# Patient Record
Sex: Female | Born: 1960 | Race: White | Hispanic: No | Marital: Married
Health system: Midwestern US, Academic
[De-identification: ages and names within clinical notes are randomized; demographics above are authoritative.]

---

## 2017-04-14 ENCOUNTER — Encounter: Admit: 2017-04-14 | Discharge: 2017-04-14 | Payer: 59

## 2017-04-14 ENCOUNTER — Ambulatory Visit: Admit: 2017-04-14 | Discharge: 2017-04-14 | Payer: 59

## 2017-04-14 ENCOUNTER — Ambulatory Visit: Admit: 2017-04-14 | Discharge: 2017-04-14 | Payer: BC Managed Care – PPO

## 2017-04-14 DIAGNOSIS — Z720 Tobacco use: ICD-10-CM

## 2017-04-14 DIAGNOSIS — R918 Other nonspecific abnormal finding of lung field: Principal | ICD-10-CM

## 2017-04-14 DIAGNOSIS — C3490 Malignant neoplasm of unspecified part of unspecified bronchus or lung: ICD-10-CM

## 2017-04-14 NOTE — Progress Notes
Date of Service: 04/14/2017       Subjective:             Journe Hallmark Laske is a 56 y.o. female.      History of Present Illness  Ms. Argie Lober presents to thoracic surgery clinic for her 6 mo follow up for lung cancer surveillance with CT imaging. Ms. Boldon is a 56 year old who presented with an incidentally found right lower lobe lung mass.??? She then underwent a right thoracoscopic right lower lobectomy and mediastinal lymphadenectomy under the direction of Dr. Bryson Dames on 04/03/16. Pathology confirmed Mucinous adenocarcinoma; Tumor Size: ???3.5 x 2.9 x 2.3 cm Visceral Pleura Invasion Present (supported by the elastic VVG stain); Pathologic Staging (pTNM) pT2aN0Mn/a.  She did seek medical oncology expert opinion.  Chemotherpay was offered with the explaination of risks and benefits.  The patient did not elect to pursue chemotherapy.  Prior CT imaging performed on 10/14/16 demonstrates previous right lower lobectomy with mild soft tissue thickening along the suture line, likely scarring. Follow-up CT chest is recommended in 6 months to assess for stability.  Development of focal groundglass opacity in the left upper, likely focal bronchiolitis. This area can also be reassessed on follow-up imaging. No thoracic lymphadenopathy. Small amount of loculated right pleural fluid. Mild emphysema. As well as a nodular thyroid.  She did establish care with endocrine for evaluation.  Elected surveillance.   ???  Pearson Forster has not had any hospitalizations or procedures since our last visit with her.  She has been busy with travel.  She also exuberently describes that her step son has recently won the lottery.  She states that the cough she was experiencing has resolved and believes this may have been more allergy induced.  She denies symptoms of hemoptysis, chest discomfort or unintentional weight loss.  ???  CT performed today 04/14/17 demonstrates Prior right lower lobectomy. ???Unchanged mild soft tissue thickening along the suture line is most consistent with scarring.  Resolution of the focal groundglass opacity in the left upper lobe that had developed on the prior exam. Slight decrease in the minimal amount of loculated right pleural   fluid. ???Mild emphysema.       Review of Systems   Constitution: Negative.   HENT: Negative.    Eyes: Negative.    Cardiovascular: Negative.    Respiratory: Negative.    Endocrine: Negative.    Hematologic/Lymphatic: Negative.    Skin: Negative.    Musculoskeletal: Positive for joint pain.   Gastrointestinal: Negative.    Genitourinary: Negative.    Neurological: Negative.    Psychiatric/Behavioral: Negative.    Allergic/Immunologic: Negative.      Past Medical History:   Diagnosis Date   ??? Fibroids     uterine   ??? Pulmonary lesion, right      Past Surgical History:   Procedure Laterality Date   ??? THORACOSCOPY Right 04/03/2016    Right video assisted THORACOSCOPY, right lower lobectomy performed by Bryson Dames, MD at [facility]   ??? PARTIAL HYSTERECTOMY  I don't know    fibroids     No Known Allergies  Social History     Social History   ??? Marital status: Married     Spouse name: N/A   ??? Number of children: N/A   ??? Years of education: N/A     Social History Main Topics   ??? Smoking status: Former Smoker     Packs/day: 0.50     Quit date: 10/09/2015   ???  Smokeless tobacco: Never Used      Comment: I don't know   ??? Alcohol use 0.0 oz/week      Comment: occasionally.   ??? Drug use: No   ??? Sexual activity: Not on file     Other Topics Concern   ??? Not on file     Social History Narrative   ??? No narrative on file     Family History   Problem Relation Age of Onset   ??? Cancer-Breast Mother    ??? Parkinson's  Father    ??? Heart Failure Father          Objective:         ??? MULTIVITAMIN (MULTIPLE VITAMINS PO) Take  by mouth.     Vitals:    04/14/17 0935   BP: 122/70   Pulse: 60   Temp: 36.6 ???C (97.9 ???F)   SpO2: 98%   Weight: 66.2 kg (146 lb)   Height: 1.702 m (5' 7) Body mass index is 22.87 kg/m???.     Physical Exam   Constitutional: She is oriented to person, place, and time. She appears well-developed and well-nourished.   HENT:   Head: Normocephalic.   Cardiovascular: Normal rate and regular rhythm.    No murmur heard.  Pulmonary/Chest: Effort normal. No respiratory distress. She has wheezes in the right lower field. She has no rales. She exhibits no tenderness.   Abdominal: Soft.   Lymphadenopathy:     She has no cervical adenopathy.   Neurological: She is alert and oriented to person, place, and time.   Skin: Skin is warm and dry.   Psychiatric: She has a normal mood and affect. Her behavior is normal. Judgment and thought content normal.            Assessment and Plan:  1. Primary mucinous adenocarcinoma of lung (HCC)           Ms. Keliyah Pavlovsky presents to thoracic surgery clinic for lung cancer surveillance.  [name] underwent a right thoracoscopic right lower lobectomy and mediastinal lymphadenectomy under the direction of Dr. Bryson Dames on 04/03/16. Pathology confirmed Mucinous adenocarcinoma; Tumor Size: ???3.5 x 2.9 x 2.3 cm Visceral Pleura Invasion Present (supported by the elastic VVG stain); Pathologic Staging (pTNM) pT2aN0Mn/a.  CT imaging today demonstrates stability of suture line thickening; resolution of left upper lobe ggo.  We are pleased with her progress and will plan to follow up in 6 months time with a CT chest with contrast for continued lung cancer surveillance.  Ms. Columbia is in agreement with plan.  I have also assisted her with establishment of PCP as her PCP has switched to an urgent care setting.  She will call with any questions or concerns.

## 2017-04-23 ENCOUNTER — Encounter: Admit: 2017-04-23 | Discharge: 2017-04-23 | Payer: 59

## 2017-05-28 ENCOUNTER — Encounter: Admit: 2017-05-28 | Discharge: 2017-05-28 | Payer: 59

## 2017-06-02 ENCOUNTER — Ambulatory Visit: Admit: 2017-06-02 | Discharge: 2017-06-02 | Payer: BC Managed Care – PPO

## 2017-06-02 ENCOUNTER — Encounter: Admit: 2017-06-02 | Discharge: 2017-06-02 | Payer: 59

## 2017-06-02 DIAGNOSIS — E041 Nontoxic single thyroid nodule: ICD-10-CM

## 2017-06-02 DIAGNOSIS — M654 Radial styloid tenosynovitis [de Quervain]: ICD-10-CM

## 2017-06-02 DIAGNOSIS — M545 Low back pain: ICD-10-CM

## 2017-06-02 DIAGNOSIS — T7840XA Allergy, unspecified, initial encounter: ICD-10-CM

## 2017-06-02 DIAGNOSIS — M79672 Pain in left foot: ICD-10-CM

## 2017-06-02 DIAGNOSIS — D219 Benign neoplasm of connective and other soft tissue, unspecified: Principal | ICD-10-CM

## 2017-06-02 DIAGNOSIS — E042 Nontoxic multinodular goiter: ICD-10-CM

## 2017-06-02 DIAGNOSIS — M25531 Pain in right wrist: Principal | ICD-10-CM

## 2017-06-02 DIAGNOSIS — J984 Other disorders of lung: ICD-10-CM

## 2017-06-02 DIAGNOSIS — Z0000 Encounter for general adult medical examination without abnormal findings: ICD-10-CM

## 2017-06-02 DIAGNOSIS — C3490 Malignant neoplasm of unspecified part of unspecified bronchus or lung: ICD-10-CM

## 2017-06-02 DIAGNOSIS — B001 Herpesviral vesicular dermatitis: ICD-10-CM

## 2017-06-02 MED ORDER — CARISOPRODOL 350 MG PO TAB
350 mg | ORAL_TABLET | ORAL | 1 refills | Status: AC | PRN
Start: 2017-06-02 — End: ?

## 2017-06-02 MED ORDER — MELOXICAM 15 MG PO TAB
15 mg | ORAL_TABLET | Freq: Every day | ORAL | 1 refills | 30.00000 days | Status: AC
Start: 2017-06-02 — End: 2017-08-17

## 2017-06-02 MED ORDER — VALACYCLOVIR 500 MG PO TAB
ORAL_TABLET | Freq: Two times a day (BID) | 3 refills | Status: AC | PRN
Start: 2017-06-02 — End: 2019-02-24

## 2017-06-02 NOTE — Progress Notes
Subjective:       History of Present Illness  Christina Eaton is a 56 y.o. female with history of mucinous adenocarcinoma of the lung status post lobectomy and mediastinal lymphadenectomy who presents today to establish care.  She was previously seeing Dr. Daisey Eaton who has changed practices.  She was referred by multiple family members who are also my patient.    Overall she reports doing well.  The lung cancer was incidentally diagnosed last July after she did a CT coronary calcium score.  This was very shocking to her.  It is been quite emotional but she is doing well.  She has only told her husband about the diagnosis.  She is trying to keep very quiet.  Her PET scan was negative for residual disease.  She has follow-up scans in February.    She quit smoking over a year ago.  She smoked off and on anywhere from 1-10 cigarettes since she was a teenager.  She was not a heavy smoker.     She exercises on a regular basis mainly walking.  Sometimes she will walk an hour and 1/2-2 hours daily.  Today for instance she has already walked 2-1/2 hours.  [place].  She was doing some weight lifting but is no longer doing that.  She hopes to get back to it.  She also tries yoga.  She has had some pain on her left foot in the ball region.  She thinks it might just be from overdoing it.  She just received some new shoes but does not use them yet.  She has been using a special pad on the balls of her feet for the past couple of weeks as well.  She thinks that will help.  She can Artie see an improvement.  Next    She also complains of right wrist pain.  Her right wrist really bothers her and it has for the past 5-6 months.  She recalls mowing the lawn back in July and having pain afterwards.  She has a couple of different braces that she uses intermittently but she has not used anything consistently.  She uses Aleve regularly with good relief.  It is starting to affect the things that she does because it aches on a regular basis.    She mentions several different supplements that she takes in order to maintain good health.  She is interested in speaking to somebody such as an integrative medicine dietitian to get more details on what she should be taking and what might actually help her.  She is currently taking something called Camu Camu, Acerola, Spirulina, MSM, multivitamin, l-lysine, vitamin D, and retinal a for her face.    She follows with Dr. Nita Eaton in dermatology and has had one lesion from her left face removed which was benign.  She goes back to see him soon.    She is interested in possible knee injections (Colief) to maintain her knee strength.  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  Sometimes her left one bothers her.  She thinks it swells at times.  It does not lock up or give out.    Her mammogram was normal September 17.  She has never had a colonoscopy but she did cologuard last year that was negative.  She is not interested in vaccines but would like to research the shingles vaccine.    Ms. Christina Eaton presents to thoracic  surgery clinic for lung cancer surveillance.  Ms. Christina Eaton underwent a right thoracoscopic right lower lobectomy and mediastinal lymphadenectomy under the direction of Dr. Bryson Eaton on 04/03/16. Pathology confirmed Mucinous adenocarcinoma; Tumor Size: ???3.5 x 2.9 x 2.3 cm Visceral Pleura Invasion Present (supported by the elastic VVG stain); Pathologic Staging (pTNM) pT2aN0Mn/a.  CT imaging today demonstrates stability of suture line thickening; resolution of left upper lobe ggo.     Past Medical History:   Diagnosis Date   ??? Allergy    ??? Fibroids     uterine   ??? Pulmonary lesion, right        Past Surgical History:   Procedure Laterality Date   ??? THORACOSCOPY Right 04/03/2016    Right video assisted THORACOSCOPY, right lower lobectomy performed by Christina Dames, MD at [facility]   ??? PARTIAL HYSTERECTOMY  I don't know fibroids       Family History   Problem Relation Age of Onset   ??? Cancer-Breast Mother    ??? Parkinson's  Father    ??? Heart Failure Father    ??? Cancer Maternal Aunt        Social History     Social History   ??? Marital status: Married     Spouse name: N/A   ??? Number of children: N/A   ??? Years of education: N/A     Occupational History   ??? Not on file.     Social History Main Topics   ??? Smoking status: Former Smoker     Packs/day: 0.50     Quit date: 10/09/2015   ??? Smokeless tobacco: Never Used      Comment: I don't know   ??? Alcohol use 0.0 oz/week      Comment: occasionally -variable amount depending on the occasion   ??? Drug use: No   ??? Sexual activity: Not on file     Other Topics Concern   ??? Not on file     Social History Narrative    She has been married for 20 years.  She has no children but her husband has some.  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  She loves being active outdoors.  She hikes and walks often.  [place] dogs.  [place]       No Known Allergies              Review of Systems   Constitutional: Negative for appetite change, chills, diaphoresis, fatigue, fever and unexpected weight change.   HENT: Negative for congestion, hearing loss, mouth sores, postnasal drip, rhinorrhea, sinus pressure, sneezing, sore throat and voice change.    Eyes: Negative for visual disturbance.   Respiratory: Negative for cough, shortness of breath and wheezing.    Cardiovascular: Negative for chest pain, palpitations and leg swelling.   Gastrointestinal: Negative for abdominal distention, abdominal pain, blood in stool, constipation, diarrhea, nausea and vomiting.   Genitourinary: Negative for dysuria, enuresis, frequency, hematuria, menstrual problem and urgency.   Musculoskeletal: Positive for arthralgias (wrist, foot) and joint swelling. Negative for back pain (occasionally low back), gait problem and myalgias.   Skin: Negative for rash and wound.   Allergic/Immunologic: Positive for environmental allergies. Negative for immunocompromised state.   Neurological: Negative for dizziness, seizures, syncope, weakness, light-headedness, numbness and headaches.   Hematological: Negative for adenopathy. Does not bruise/bleed  easily.   Psychiatric/Behavioral: Negative for dysphoric mood and sleep disturbance. The patient is not nervous/anxious.          Objective:         ??? carisoprodol(+) (SOMA) 350 mg tablet Take one tablet by mouth every 8 hours as needed for Muscle Cramps.   ??? meloxicam (MOBIC) 15 mg tablet Take one tablet by mouth daily. With food   ??? MULTIVITAMIN (MULTIPLE VITAMINS PO) Take  by mouth.   ??? valACYclovir (VALTREX) 500 mg tablet 500mg  PO BID x 1 day prn cold sore flare     Vitals:    06/02/17 1419   BP: 104/67   Pulse: 71   Resp: 16   Temp: 36.4 ???C (97.6 ???F)   TempSrc: Oral   Weight: 64.4 kg (142 lb)   Height: 171.5 cm (67.5)     Body mass index is 21.91 kg/m???.     Physical Exam   Constitutional: She is oriented to person, place, and time. She appears well-developed and well-nourished.   HENT:   Head: Normocephalic and atraumatic.   Right Ear: External ear normal.   Left Ear: External ear normal.   Nose: Nose normal.   Mouth/Throat: Oropharynx is clear and moist. No oropharyngeal exudate.   Eyes: Pupils are equal, round, and reactive to light. Conjunctivae and EOM are normal. No scleral icterus.   Neck: Normal range of motion. Neck supple. No thyromegaly present.   Cardiovascular: Normal rate, regular rhythm, normal heart sounds and intact distal pulses.    No murmur heard.  Pulmonary/Chest: Effort normal and breath sounds normal. She has no wheezes. She has no rales.   Abdominal: Soft. Bowel sounds are normal. She exhibits no mass. There is no tenderness.   Musculoskeletal: She exhibits no edema. Right wrist: She exhibits tenderness. She exhibits normal range of motion, no bony tenderness and no swelling.        Left foot: There is tenderness (ball of foot 2/3rd toe). There is normal range of motion and no swelling.   finklestein + right   Lymphadenopathy:     She has no cervical adenopathy.   Neurological: She is alert and oriented to person, place, and time.   Skin: Skin is warm and dry. No rash noted.   Psychiatric: She has a normal mood and affect. Her behavior is normal. Judgment and thought content normal.   Nursing note and vitals reviewed.           Assessment and Plan:     I saw Gerriann Ea today to establish care.     1. Preventative health care    2. Right wrist pain    3. Tenosynovitis, de Quervain    4. Primary mucinous adenocarcinoma of lung (HCC)    5. Thyroid nodule    6. Recurrent cold sores    7. Chronic bilateral low back pain without sciatica    8. Left foot pain    9. Non-toxic multinodular goiter      Problem   Preventative Health Care    Health Maintenance / Prevention  Health Maintenance   Topic Date Due   ??? HIV SCREENING  04/12/1976   ??? SHINGLES RECOMBINANT VACCINE (1 of 2) 04/13/2011   ??? INFLUENZA VACCINE  06/07/2017   ??? BREAST CANCER SCREENING  05/24/2018   ??? PHYSICAL (COMPREHENSIVE) EXAM  06/02/2018   ??? COLORECTAL CANCER SCREENING  01/01/2019   ??? TETANUS VACCINE  12/04/2024   ??? PERTUSSIS VACCINE  Completed   ???  HEPATITIS C SCREENING  Completed     Immunization History   Administered Date(s) Administered   ??? Tdap Vaccine 12/05/2014     Vaccines -    PNA/PPSV23  -declines  Prevnar/PCV13    Zoster (>60y) -Shingrix discussed, she will consider  Influenza (> 50y or RF annually) -  Td/Tdap (every 10 years)  -  DEXA hip/spine - n/a  Low Dose Chest CT- -already getting with [facility]  Colonoscopy - discussed, cologuard neg 6/17, she will consider by next due - 2020   Pap Smear  -s/p hyster benign reasons  Mammogram - normal 05/24/17   Lipids -ordered  ASA -n/a Counseled on age/gender/risk appropriate safety/preventative care items  Depression Screening:  Patient Scores:  PHQ-2: PHQ-2 Score: 0 (06/02/2017  2:20 PM)  PHQ-9: No Data Recorded  Interventions:  PHQ-2: PHQ-2 Score less than 3: No follow-up or recommendations are necessary at this time (06/02/2017  2:20 PM)  Depression Interventions PHQ-2/9: No Data Recorded       Non-Toxic Multinodular Goiter    Saw endo, thought benign  Check TSH yearly     Primary Mucinous Adenocarcinoma of Lung (Hcc)    Mrs. Mezo is a 56 yo female with pmh of tobacco abuse with recently diagnosed mucinous adenocarcinoma of the lung. She was having a calcium score screening done for cardiovascular risk calculation and the imaging showed a mass in her right lower lobe. She was referred to Dr. Ranae Plumber with pulmonary who obtained a PET scan which showed Mild FDG uptake within the right lower lobe pulmonary mass demonstrating maximum SUV of 2.06. Next a CT guided biopsy was done that was not diagnostic of malignancy. On 04/03/16 she was taken for wedge resection with pathologic evaluation concerning for malignancy, so a right lower lobectomy was completed. Pathology showed a 3.5x2.5x2.9cm primary tumor with visceral pleural invasion, 4 lymph nodes sampled were negative for malignancy. PD-L1 expression was 10%. PT2aN0M0, stage IB.    We discussed the role of adjuvant chemotherapy in reducing her risk of recurrence. Given that her tumor was 3.5cm and had a poor prognostic factor of visceral pleural invasion we are recommending adjuvant cisplatin 75mg /m2 and pemetrexed 500mg /m2 Q 21 days x 4 cycles.     She would like some time to think about her options and discuss with family. She plans to call us with her decision. I have already entered the treatment plan should she decide to go forward with chemotherapy we should start within the next 2 weeks. We did explore the adjuvant clinical trial available, but she does not qualify for them due to tumor being <4cm. If she is not planning on adjuvant chemotherapy we would be happy to see her in 6 months with CT chest for surveillance. Per NCCN guidelines surveillance would consist of H&P, labs and CT w/ contrast every 6 months for 2-3 yrs then annually for 5 yrs.     Has follow up with CT in Feb 2019       Right wrist pain - de'quervain's tenosynovitis  - spica thumb brace consistently 4 weeks + mobic  - PT referral  - can send to ortho in future if needed    Left foot pain   - likely bone bruise  - new shoes/support, nsaids  - will get xray if no better    Intermittent low back pain   - refill soma    Recurrent cold sores  - refill valtrex    I will see her back yearly and  sooner as needed.       Orders Placed This Encounter   ??? CBC AND DIFF   ??? COMPREHENSIVE METABOLIC PANEL   ??? LIPID PROFILE   ??? TSH WITH FREE T4 REFLEX today   ??? PHYSICAL THERAPY   ??? carisoprodol(+) (SOMA) 350 mg tablet   ??? valACYclovir (VALTREX) 500 mg tablet   ??? meloxicam (MOBIC) 15 mg tablet     Patient Instructions   It was nice to meet you today. Thank you for coming into clinic.  Try to wear the thumb spica brace as much as possible - definitely all night. Try mobic daily with food for at least 4 weeks then reassess.  We can ask PT if there are other modalities that may help you.  If you don't get called by PT please let me know.   Do fasting labs anytime - no food 8 hrs.   Consider colonoscopy for your next screening in 2020 or sooner if you have issues with your bowels.     Consider Shingrix vaccine.    Ask ortho about knee injections.     The following referral is in place for you to schedule an appoinment at your convenience: PT  Look into [facility] for their dietician/supplement advice services.  (Brad Dyer,DO)    Let me know after 1-2 months if you don't have improvement in your foot with new shoes and the cushion. Follow up with me yearly and sooner as needed.     Routine Clinic Information:  Please don't hesitate to call if you have any problems or questions. My nurse is Carly and she can be reached at [phone].  If she does not answer, please leave a voicemail as she is probably rooming other patients.   You may also message [place] in MyChart.    For refills on medications, please have your pharmacy fax a refill authorization request form to our office at Fax) [phone]. Please allow at least 3 business days for refill requests.     For urgent issues after business hours/weekends/holidays call [phone] and request for the outpatient internal medicine physician to be paged    We offer same day appointments for your acute health concerns. These appointments are on a first come, first serve basis. Please call [phone] if you would like to make an appointment. If I am not available, you can see any of my partners or try to see me the next day (call at 8am).      Take care,     Dr. Ramonita Lab and Wenda Low

## 2017-06-03 ENCOUNTER — Encounter: Admit: 2017-06-03 | Discharge: 2017-06-03 | Payer: 59

## 2017-06-03 DIAGNOSIS — D219 Benign neoplasm of connective and other soft tissue, unspecified: Principal | ICD-10-CM

## 2017-06-03 DIAGNOSIS — J984 Other disorders of lung: ICD-10-CM

## 2017-06-03 DIAGNOSIS — T7840XA Allergy, unspecified, initial encounter: ICD-10-CM

## 2017-06-07 ENCOUNTER — Encounter: Admit: 2017-06-07 | Discharge: 2017-06-07 | Payer: 59

## 2017-06-08 LAB — CBC AND DIFF
Lab: 12 g/dL (ref >=60)
Lab: 13 % (ref 11.0–15.0)
Lab: 32 pg (ref 27.0–33.0)
Lab: 33 g/dL (ref 32.0–36.0)
Lab: 37 % (ref >=60)
Lab: 380 10*3/uL (ref 140–400)
Lab: 6.1 10*3/uL (ref <150)
Lab: 95 fL (ref 80.0–100.0)

## 2017-06-08 LAB — LIPID PROFILE: Lab: 214 mg/dL — ABNORMAL HIGH (ref <200)

## 2017-06-08 LAB — COMPREHENSIVE METABOLIC PANEL: Lab: 113 mg/dL — ABNORMAL HIGH (ref >50)

## 2017-06-08 LAB — TSH WITH FREE T4 REFLEX: Lab: 3.7 m[IU]/L (ref 0.40–4.50)

## 2017-06-14 ENCOUNTER — Encounter: Admit: 2017-06-14 | Discharge: 2017-06-14 | Payer: 59

## 2017-06-14 NOTE — Progress Notes
Do you want me to notify pt that her labs are stable and to increase exercise and eat a heart healthy did to lower cholesterol?Janett Billow, LPN

## 2017-06-15 ENCOUNTER — Encounter: Admit: 2017-06-15 | Discharge: 2017-06-15 | Payer: 59

## 2017-06-15 DIAGNOSIS — R7301 Impaired fasting glucose: Principal | ICD-10-CM

## 2017-06-15 DIAGNOSIS — Z0000 Encounter for general adult medical examination without abnormal findings: ICD-10-CM

## 2017-06-15 NOTE — Progress Notes
I released results with message.

## 2017-06-16 ENCOUNTER — Encounter: Admit: 2017-06-16 | Discharge: 2017-06-16 | Payer: 59

## 2017-06-17 ENCOUNTER — Encounter: Admit: 2017-06-17 | Discharge: 2017-06-17 | Payer: 59

## 2017-06-17 DIAGNOSIS — M25531 Pain in right wrist: Principal | ICD-10-CM

## 2017-06-17 DIAGNOSIS — M654 Radial styloid tenosynovitis [de Quervain]: ICD-10-CM

## 2017-06-17 NOTE — Telephone Encounter
Hand therapy is requesting a referral for OT vs PT.  OT referral placed.  Willa Frater, LPN

## 2017-07-12 ENCOUNTER — Encounter: Admit: 2017-07-12 | Discharge: 2017-08-06 | Payer: BC Managed Care – PPO

## 2017-07-12 ENCOUNTER — Encounter: Admit: 2017-07-12 | Discharge: 2017-07-12 | Payer: 59

## 2017-07-12 DIAGNOSIS — T7840XA Allergy, unspecified, initial encounter: ICD-10-CM

## 2017-07-12 DIAGNOSIS — J984 Other disorders of lung: ICD-10-CM

## 2017-07-12 DIAGNOSIS — D219 Benign neoplasm of connective and other soft tissue, unspecified: Principal | ICD-10-CM

## 2017-07-12 NOTE — Progress Notes
Hand Therapy Initial Evaluation and Plan of Care:     Name: Christina Eaton, Christina Eaton  DOB: 12/22/1960  MRN#: [id number]  Referring Physician: Dr Ardelle Park, MD  Insurance:  Lorella Nimrod  Injury/Onset Date: 6 months   Surgical Date: No surgery  Medical Diagnosis:  Right DeQuervain's   Treatment Diagnosis: Wrist pain  Date of Initial Evaluation: 07-12-17  Visit #   1    Subjective:     History of Present Condition/Mechanism of Injury: Symptoms onset after mowing lawn    Pain: Right:  Location: Wrist and Thumb    Pain Rating:   Current: 4/10     Patient Goals: Decrease in pain     Objective:     Evaluation:     Hand Exam     Treatment:    Right:   Wrist and Thumb    Treatment Provided:    Orthosis:    Prefabricated Orthosis Issued: Radial Thumb Spica    Purpose of Immobilization:To protect soft tissue    Orthosis Instructions:  Verbally instructed in orthosis/brace care and precautions. Verbally instructed in wearing schedule for orthosis/brace. At all times and Except hygiene.  Pt verbalized understanding of all instruction.                                Assessment:    Problems: Pain    Rationale for Therapy:Clinical Judgment    Rehab Potential: Good     Contraindications to Therapy:none     Long Term Treatment Goals:     Patient will get relief from pain to improve healing and quality of life.      Short Term Treatment Goals:     Goal Number:  1  Goal: Patient to verbalize understanding of orthosis instructions including wearing schedule and precautions.  Goal Status:  Met                           Plan of Care:    Treatment Plan:     Issue Prefabricated Orthosis    Frequency: Comments:Pt to call in four weeks if pain has not improved.

## 2017-08-06 DIAGNOSIS — M25531 Pain in right wrist: Principal | ICD-10-CM

## 2017-08-06 DIAGNOSIS — M654 Radial styloid tenosynovitis [de Quervain]: ICD-10-CM

## 2017-08-16 ENCOUNTER — Encounter: Admit: 2017-08-16 | Discharge: 2017-08-16 | Payer: 59

## 2017-08-16 DIAGNOSIS — M654 Radial styloid tenosynovitis [de Quervain]: ICD-10-CM

## 2017-08-16 DIAGNOSIS — M25531 Pain in right wrist: Principal | ICD-10-CM

## 2017-08-16 NOTE — Telephone Encounter
Pharmacy requesting refill of: meloxicam (MOBIC) 15 mg tablet.  Last Refill: 06/02/17  LOV: 06/02/17    This is not a standing order. Routing to Dr. Star Age for approval. Martinique McFall, LPN

## 2017-08-17 MED ORDER — MELOXICAM 15 MG PO TAB
ORAL_TABLET | Freq: Every day | ORAL | 5 refills | 30.00000 days | Status: AC
Start: 2017-08-17 — End: 2019-04-19

## 2017-08-25 ENCOUNTER — Encounter: Admit: 2017-08-25 | Discharge: 2017-08-25 | Payer: 59

## 2017-10-20 ENCOUNTER — Encounter: Admit: 2017-10-20 | Discharge: 2017-10-20 | Payer: 59

## 2017-10-20 ENCOUNTER — Ambulatory Visit: Admit: 2017-10-20 | Discharge: 2017-10-20 | Payer: 59

## 2017-10-20 ENCOUNTER — Ambulatory Visit: Admit: 2017-10-20 | Discharge: 2017-10-20 | Payer: BC Managed Care – PPO

## 2017-10-20 DIAGNOSIS — Z85118 Personal history of other malignant neoplasm of bronchus and lung: ICD-10-CM

## 2017-10-20 DIAGNOSIS — C3490 Malignant neoplasm of unspecified part of unspecified bronchus or lung: Principal | ICD-10-CM

## 2017-10-20 DIAGNOSIS — Z08 Encounter for follow-up examination after completed treatment for malignant neoplasm: ICD-10-CM

## 2017-10-20 DIAGNOSIS — T7840XA Allergy, unspecified, initial encounter: ICD-10-CM

## 2017-10-20 DIAGNOSIS — J984 Other disorders of lung: ICD-10-CM

## 2017-10-20 DIAGNOSIS — Z87891 Personal history of nicotine dependence: ICD-10-CM

## 2017-10-20 DIAGNOSIS — D219 Benign neoplasm of connective and other soft tissue, unspecified: Principal | ICD-10-CM

## 2017-10-20 MED ORDER — IOHEXOL 350 MG IODINE/ML IV SOLN
70 mL | Freq: Once | INTRAVENOUS | 0 refills | Status: CP
Start: 2017-10-20 — End: ?
  Administered 2017-10-20: 16:00:00 70 mL via INTRAVENOUS

## 2017-10-20 MED ORDER — SODIUM CHLORIDE 0.9 % IJ SOLN
50 mL | Freq: Once | INTRAVENOUS | 0 refills | Status: CP
Start: 2017-10-20 — End: ?
  Administered 2017-10-20: 16:00:00 50 mL via INTRAVENOUS

## 2017-10-21 ENCOUNTER — Encounter: Admit: 2017-10-21 | Discharge: 2017-10-21 | Payer: 59

## 2017-10-25 ENCOUNTER — Encounter: Admit: 2017-10-25 | Discharge: 2017-10-26

## 2017-10-25 DIAGNOSIS — R69 Illness, unspecified: Principal | ICD-10-CM

## 2017-10-26 ENCOUNTER — Encounter: Admit: 2017-10-26 | Discharge: 2017-10-26 | Payer: 59

## 2017-10-26 DIAGNOSIS — E042 Nontoxic multinodular goiter: Principal | ICD-10-CM

## 2017-10-26 DIAGNOSIS — R69 Illness, unspecified: Principal | ICD-10-CM

## 2017-12-09 ENCOUNTER — Encounter: Admit: 2017-12-09 | Discharge: 2017-12-09 | Payer: 59

## 2017-12-09 DIAGNOSIS — T7840XA Allergy, unspecified, initial encounter: ICD-10-CM

## 2017-12-09 DIAGNOSIS — D219 Benign neoplasm of connective and other soft tissue, unspecified: Principal | ICD-10-CM

## 2017-12-09 DIAGNOSIS — J984 Other disorders of lung: ICD-10-CM

## 2018-01-05 ENCOUNTER — Encounter: Admit: 2018-01-05 | Discharge: 2018-01-05 | Payer: 59

## 2018-02-04 ENCOUNTER — Encounter: Admit: 2018-02-04 | Discharge: 2018-02-04 | Payer: 59

## 2018-02-07 ENCOUNTER — Encounter: Admit: 2018-02-07 | Discharge: 2018-02-07 | Payer: 59

## 2018-03-04 ENCOUNTER — Encounter: Admit: 2018-03-04 | Discharge: 2018-03-04 | Payer: 59

## 2018-04-01 ENCOUNTER — Encounter: Admit: 2018-04-01 | Discharge: 2018-04-01 | Payer: 59

## 2018-04-07 ENCOUNTER — Encounter: Admit: 2018-04-07 | Discharge: 2018-04-07 | Payer: 59

## 2018-04-20 ENCOUNTER — Ambulatory Visit: Admit: 2018-04-20 | Discharge: 2018-04-20 | Payer: 59

## 2018-04-20 ENCOUNTER — Encounter: Admit: 2018-04-20 | Discharge: 2018-04-20 | Payer: 59

## 2018-04-20 DIAGNOSIS — C3490 Malignant neoplasm of unspecified part of unspecified bronchus or lung: Principal | ICD-10-CM

## 2018-04-20 DIAGNOSIS — Z87891 Personal history of nicotine dependence: ICD-10-CM

## 2018-04-20 DIAGNOSIS — Z08 Encounter for follow-up examination after completed treatment for malignant neoplasm: ICD-10-CM

## 2018-04-20 DIAGNOSIS — Z85118 Personal history of other malignant neoplasm of bronchus and lung: ICD-10-CM

## 2018-04-20 DIAGNOSIS — D219 Benign neoplasm of connective and other soft tissue, unspecified: Principal | ICD-10-CM

## 2018-04-20 DIAGNOSIS — T7840XA Allergy, unspecified, initial encounter: ICD-10-CM

## 2018-04-20 DIAGNOSIS — J984 Other disorders of lung: ICD-10-CM

## 2018-06-24 ENCOUNTER — Encounter: Admit: 2018-06-24 | Discharge: 2018-06-24 | Payer: 59

## 2018-07-16 ENCOUNTER — Encounter: Admit: 2018-07-16 | Discharge: 2018-07-16 | Payer: 59

## 2018-07-16 ENCOUNTER — Ambulatory Visit: Admit: 2018-07-16 | Discharge: 2018-07-17 | Payer: 59

## 2018-07-16 DIAGNOSIS — T7840XA Allergy, unspecified, initial encounter: ICD-10-CM

## 2018-07-16 DIAGNOSIS — R05 Cough: ICD-10-CM

## 2018-07-16 DIAGNOSIS — J22 Unspecified acute lower respiratory infection: Principal | ICD-10-CM

## 2018-07-16 DIAGNOSIS — J984 Other disorders of lung: ICD-10-CM

## 2018-07-16 DIAGNOSIS — D219 Benign neoplasm of connective and other soft tissue, unspecified: Principal | ICD-10-CM

## 2018-07-16 MED ORDER — AZITHROMYCIN 250 MG PO TAB
ORAL_TABLET | Freq: Every day | 0 refills | Status: AC
Start: 2018-07-16 — End: 2019-04-19

## 2018-07-19 ENCOUNTER — Encounter: Admit: 2018-07-19 | Discharge: 2018-07-19 | Payer: 59

## 2018-08-11 ENCOUNTER — Encounter: Admit: 2018-08-11 | Discharge: 2018-08-11 | Payer: 59

## 2018-08-12 MED ORDER — DOXYCYCLINE HYCLATE 100 MG PO TAB
100 mg | ORAL_TABLET | Freq: Two times a day (BID) | ORAL | 0 refills | 8.00000 days | Status: AC
Start: 2018-08-12 — End: ?

## 2018-12-04 ENCOUNTER — Encounter: Admit: 2018-12-04 | Discharge: 2018-12-04 | Payer: 59

## 2019-01-26 ENCOUNTER — Encounter: Admit: 2019-01-26 | Discharge: 2019-01-26 | Payer: 59

## 2019-01-26 MED ORDER — TRETINOIN 0.1 % TP CREA
Freq: Every evening | TOPICAL | 2 refills | Status: AC
Start: 2019-01-26 — End: ?

## 2019-02-24 ENCOUNTER — Encounter: Admit: 2019-02-24 | Discharge: 2019-02-24

## 2019-02-24 DIAGNOSIS — R7301 Impaired fasting glucose: Secondary | ICD-10-CM

## 2019-02-24 DIAGNOSIS — Z0000 Encounter for general adult medical examination without abnormal findings: Secondary | ICD-10-CM

## 2019-02-24 DIAGNOSIS — B001 Herpesviral vesicular dermatitis: Secondary | ICD-10-CM

## 2019-02-24 MED ORDER — VALACYCLOVIR 500 MG PO TAB
ORAL_TABLET | Freq: Two times a day (BID) | 3 refills | Status: DC | PRN
Start: 2019-02-24 — End: 2020-03-25

## 2019-02-24 NOTE — Telephone Encounter
Refill Request received from patient's [employer].    Patient last seen 06/02/2017 with plan to continue Valtrex 500mg  tablet, take one tablet by mouth twice daily for cold sore.    No follow up appointment scheduled at this time.    Sent patient a MyChart message about scheduling a future appt.    Medication not on protocol, please advise.    Truitt Leep, RN

## 2019-03-13 ENCOUNTER — Encounter: Admit: 2019-03-13 | Discharge: 2019-03-13

## 2019-03-13 NOTE — Telephone Encounter
Patient called and stated she needs to make an appointment for her physical with Dr. Star Age, also patient wondering if Dr. Star Age would order labs that she can get done before her appointment.  I told patient that Dr. Star Age was out of the office this week, but that I would send her a message for when she returns to have those lab orders placed!    Patient also states she has some bumps under her eye that she got after working in her garden that she thinks could be poison ivy. I told patient to call our scheduling number at [phone], option 1 to make a telehealth appointment with another provider so they could prescribe something for it and patient verbalized understanding.    Truitt Leep, RN

## 2019-03-16 ENCOUNTER — Encounter: Admit: 2019-03-16 | Discharge: 2019-03-16

## 2019-03-23 ENCOUNTER — Encounter: Admit: 2019-03-23 | Discharge: 2019-03-23

## 2019-03-23 NOTE — Telephone Encounter
Labs ordered- please fax them to the lab she requested. Thanks

## 2019-03-24 ENCOUNTER — Encounter: Admit: 2019-03-24 | Discharge: 2019-03-24

## 2019-03-24 NOTE — Telephone Encounter
Labs ordered in other encounter.

## 2019-04-06 ENCOUNTER — Encounter: Admit: 2019-04-06 | Discharge: 2019-04-06

## 2019-04-06 DIAGNOSIS — Z8619 Personal history of other infectious and parasitic diseases: Secondary | ICD-10-CM

## 2019-04-12 ENCOUNTER — Encounter: Admit: 2019-04-12 | Discharge: 2019-04-12

## 2019-04-17 NOTE — Progress Notes
Date of Service: 04/19/2019       Subjective:             Christina Eaton is a 58 y.o. female.      History of Present Illness  Christina Eaton presents to thoracic surgery clinic for her 1 year follow up CT scan of her chest for cancer surveillance. She presented with an incidentally found right lower lobe lung mass.??????She then underwent a right thoracoscopic right lower lobectomy and mediastinal lymphadenectomy under the direction of Dr. Bryson Dames on 04/03/16. Pathology confirmed Mucinous adenocarcinoma; Tumor Size: ???3.5 x 2.9 x 2.3 cm Visceral Pleura Invasion Present (supported by the elastic VVG stain); Pathologic Staging (pTNM) pT2aN0Mn/a. ???She did seek medical oncology expert opinion. ???Chemotherpay was offered with the explaination of risks and benefits. ???The patient did not elect to pursue chemotherapy.??? Last CT chest 10/20/17 No evidence of disease progression in the thorax. Multiple thyroid nodules meet criteria for thyroid ultrasound if not already performed. She did have outside [place] in February 2019.    Last CT chest 04/20/18  1. ???Stable right lower lobectomy changes without evidence of recurrent or   residual right lung mass.    2. ???No thoracic lymphadenopathy.    3. ???There are 2 tiny right lung nodules which are stable for greater than   2 years compatible with scars or granulomas.    4. ???Moderate emphysema.    Today she reports no procedures, hospitalizations, or problems since our last visit. She reports that she feels well and continues to exercise daily. She states I can't even believe I smoked for all of those years, and that she has made a 180 as to how she lives her life ie. Eating, exercising etc. She tells me that a good resource for patient interested in nutrition and cancer is Thayer Ohm beat cancer, she read this book and found it quite helpful.. She denies cough, hemoptysis, shortness of air, chest pain, dysphagia, dyspepsia, abdominal pain, change in bowel habits, fevers, chills, night sweats, unintentional weight loss, new headaches, and bone pain.       Christina Eaton had a CT scan completed today that showed:    CT Impression:    1. ???Stable right lower lobectomy changes without evidence of recurrent or   residual right lung mass.    2. ???No evidence of thoracic metastatic disease.    3. ???Moderate emphysema.    This was reviewed at length with Christina Eaton.  A copy of her CT report was given her.       Review of Systems   Constitution: Negative.   HENT: Negative.    Eyes: Negative.    Cardiovascular: Negative.    Respiratory: Negative.    Endocrine: Negative.    Hematologic/Lymphatic: Negative.    Skin: Negative.    Musculoskeletal: Negative.    Gastrointestinal: Negative.    Genitourinary: Negative.    Neurological: Negative.    Psychiatric/Behavioral: Negative.        Medical History:   Diagnosis Date   ??? Allergy    ??? Fibroids     uterine   ??? Pulmonary lesion, right      Surgical History:   Procedure Laterality Date   ??? Right video assisted THORACOSCOPY, right lower lobectomy Right 04/03/2016    Performed by Bryson Dames, MD at [facility]   ??? PARTIAL HYSTERECTOMY  I don't know    fibroids     No Known Allergies  Social History  Socioeconomic History   ??? Marital status: Married     Spouse name: Not on file   ??? Number of children: Not on file   ??? Years of education: Not on file   ??? Highest education level: Not on file   Occupational History   ??? Not on file   Tobacco Use   ??? Smoking status: Former Smoker     Packs/day: 0.50     Last attempt to quit: 10/09/2015     Years since quitting: 3.5   ??? Smokeless tobacco: Never Used   ??? Tobacco comment: I don't know   Substance and Sexual Activity   ??? Alcohol use: Yes     Alcohol/week: 0.0 standard drinks     Comment: occasionally -variable amount depending on the occasion   ??? Drug use: No   ??? Sexual activity: Not on file   Other Topics Concern   ??? Not on file   Social History Narrative She has been married for 20 years.  She has no children but her husband has some.  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  She loves being active outdoors.  She hikes and walks often.  [place] dogs.  [place]     Family History   Problem Relation Age of Onset   ??? Cancer-Breast Mother    ??? Parkinson's  Father    ??? Heart Failure Father    ??? Cancer Maternal Aunt        Objective:         ??? carisoprodol(+) (SOMA) 350 mg tablet Take one tablet by mouth every 8 hours as needed for Muscle Cramps.   ??? MULTIVITAMIN (MULTIPLE VITAMINS PO) Take  by mouth.   ??? tretinoin (RETIN-A) 0.1 % topical cream Apply  topically to affected area at bedtime daily.   ??? valACYclovir (VALTREX) 500 mg tablet TAKE 1 TABLET BY MOUTH TWICE DAILY FOR 1 DAY AS NEEDED FOR  COLD  SORE  FLARE     Vitals:    04/19/19 1046   BP: 124/80   BP Source: Arm, Left Upper   Pulse: 67   Temp: 37.1 ???C (98.7 ???F)   SpO2: 98%   Weight: 60.3 kg (133 lb)   Height: 1.715 m (5' 7.5)   PainSc: Zero     Body mass index is 20.52 kg/m???.     Physical Exam  Constitutional:       Appearance: She is well-developed.   HENT:      Head: Normocephalic.   Neck:      Musculoskeletal: Neck supple.   Cardiovascular:      Rate and Rhythm: Normal rate and regular rhythm.      Heart sounds: Normal heart sounds.   Pulmonary:      Effort: Pulmonary effort is normal.      Breath sounds: Normal breath sounds.   Musculoskeletal: Normal range of motion.   Lymphadenopathy:      Cervical: No cervical adenopathy.   Skin:     General: Skin is warm and dry.   Neurological:      Mental Status: She is alert and oriented to person, place, and time.   Psychiatric:         Behavior: Behavior normal.         Thought Content: Thought content normal.  Judgment: Judgment normal.              Assessment and Plan:  1. Primary mucinous adenocarcinoma of lung (HCC) 2. Encounter for follow-up surveillance of lung cancer        Overall, we are very pleased with Christina Eaton progress.  We will plan to continue with radiographic surveillance of the chest in 6 months??? time with Ct chest without contrast. We again discussed the rationale for having CT chest for surveillance completed at our facility to have our select group of chest radiologists interpret the CT. Sophia Muhs is encouraged to call should any issues or concerns arise.  We would be happy to assist in any way possible.Please do not hesitate to contact us with any questions or concerns.    Jodi Marble, APRN-C  Thoracic Surgery and Lung Cancer Screening

## 2019-04-19 ENCOUNTER — Encounter: Admit: 2019-04-19 | Discharge: 2019-04-19

## 2019-04-19 ENCOUNTER — Ambulatory Visit: Admit: 2019-04-19 | Discharge: 2019-04-19

## 2019-04-19 DIAGNOSIS — R922 Inconclusive mammogram: Secondary | ICD-10-CM

## 2019-04-19 DIAGNOSIS — C3490 Malignant neoplasm of unspecified part of unspecified bronchus or lung: Principal | ICD-10-CM

## 2019-04-19 DIAGNOSIS — J984 Other disorders of lung: Secondary | ICD-10-CM

## 2019-04-19 DIAGNOSIS — Z08 Encounter for follow-up examination after completed treatment for malignant neoplasm: Secondary | ICD-10-CM

## 2019-04-19 DIAGNOSIS — Z85118 Personal history of other malignant neoplasm of bronchus and lung: Secondary | ICD-10-CM

## 2019-04-19 DIAGNOSIS — Z1231 Encounter for screening mammogram for malignant neoplasm of breast: Secondary | ICD-10-CM

## 2019-04-19 DIAGNOSIS — Z0000 Encounter for general adult medical examination without abnormal findings: Secondary | ICD-10-CM

## 2019-04-19 DIAGNOSIS — T7840XA Allergy, unspecified, initial encounter: Secondary | ICD-10-CM

## 2019-04-19 DIAGNOSIS — E042 Nontoxic multinodular goiter: Secondary | ICD-10-CM

## 2019-04-19 DIAGNOSIS — D219 Benign neoplasm of connective and other soft tissue, unspecified: Secondary | ICD-10-CM

## 2019-04-19 NOTE — Progress Notes
Subjective:       History of Present Illness  Christina Eaton is a 58 y.o. female with history of mucinous adenocarcinoma of the lung status post lobectomy and mediastinal lymphadenectomy who presents today for routine health maintenance.  Last visit was initial visit in 2018.     She had a surveillance CT today and saw cardiothoracic surgery today for surveillance of her mucinous adenocarcinoma of the lung.  Her CT scan today showed stable right lower lobe lobectomy changes without evidence of recurrence.  There is no thoracic metastatic disease.  She has moderate emphysema.  This was noted as mild 2 years ago.  She has absolutely no symptoms such as shortness of breath, cough or wheezing.  She used to smoke in the past but it has been 3 years since she smoked anything.  She wonders if anything needs done.    Her labs were done on August 5 showing a normal chemistry with a hemoglobin A1c of 5.3, liver enzymes normal, TSH 2.23, vitamin D 51, cholesterol levels are excellent with an LDL of 75 and HDL of 110.  Triglycerides 77.  CBC is normal with a hemoglobin of 12.3.  We reviewed the results of her labs together.    Her mammogram was good in October of last year.  She asks if she should come to [place].  I looked at her mammogram and she does have dense breast so I discussed the supplemental screening that we do at [place] and that it would be a good idea.    COVID antibody was negative on August 5.  She had a negative Cologuard Jan 14, 2016 so she is due now.  She wants to find out if her insurance pays for it first.  She is not interested in colonoscopy right now.    She walks 2 to 9 miles per day.  She also started jogging.  She was doing her therapy but obviously this topic of 19.    She has noticed a knot in the sole of her right foot and some of the left.  The right side is much larger and has been there for a year or so.  It is tender if she pushes on it but it really does not affect her walking or exercise.  She got some new shoes that have really helped - Alta shoes, was wearing [place].     She and her husband have been using Retin-A to help with wrinkles.  She would like refill sent for both of them.    She has a mole on her right leg that she has been to look at.  She has been itching it and picking at it to see if it will come off.  It does not bleed.  She needs to see a dermatologist but would prefer to stay closer to their home.  [place] there.      Medical History:   Diagnosis Date   ??? Allergy    ??? Fibroids     uterine   ??? Pulmonary lesion, right           Review of Systems   Constitutional: Negative for appetite change, chills, diaphoresis, fatigue, fever and unexpected weight change.   HENT: Negative for congestion, ear pain, hearing loss, mouth sores, postnasal drip, rhinorrhea, sinus pressure, sneezing, sore throat, trouble swallowing and voice change.    Eyes: Negative for visual disturbance.  Respiratory: Negative for cough, shortness of breath and wheezing.    Cardiovascular: Negative for chest pain, palpitations and leg swelling.   Gastrointestinal: Negative for abdominal distention, abdominal pain, blood in stool, constipation, diarrhea, nausea and vomiting.   Genitourinary: Negative for dysuria, enuresis, frequency, hematuria, menstrual problem and urgency.   Musculoskeletal: Positive for arthralgias (foot, soles). Negative for back pain, gait problem and myalgias.   Skin: Negative for rash and wound.        New mole thigh     Allergic/Immunologic: Positive for environmental allergies. Negative for immunocompromised state.   Neurological: Negative for dizziness, seizures, syncope, weakness, light-headedness, numbness and headaches.   Hematological: Negative for adenopathy. Does not bruise/bleed easily.   Psychiatric/Behavioral: Negative for dysphoric mood and sleep disturbance. The patient is not nervous/anxious. Objective:         ??? carisoprodol(+) (SOMA) 350 mg tablet Take one tablet by mouth every 8 hours as needed for Muscle Cramps.   ??? MULTIVITAMIN (MULTIPLE VITAMINS PO) Take  by mouth.   ??? tretinoin (RETIN-A) 0.1 % topical cream Apply  topically to affected area at bedtime daily.   ??? valACYclovir (VALTREX) 500 mg tablet TAKE 1 TABLET BY MOUTH TWICE DAILY FOR 1 DAY AS NEEDED FOR  COLD  SORE  FLARE     Vitals:    04/19/19 1144   Temp: 37.1 ???C (98.7 ???F)   Weight: 60.3 kg (133 lb)   Height: 170.2 cm (67)   PainSc: Zero     Body mass index is 20.83 kg/m???.     Physical Exam  Vitals signs and nursing note reviewed.   Constitutional:       Appearance: Normal appearance. She is well-developed.   HENT:      Head: Normocephalic and atraumatic.      Right Ear: Tympanic membrane, ear canal and external ear normal.      Left Ear: Tympanic membrane, ear canal and external ear normal.      Nose: Nose normal.      Mouth/Throat:      Mouth: Mucous membranes are moist.      Pharynx: No oropharyngeal exudate.   Eyes:      General: No scleral icterus.     Conjunctiva/sclera: Conjunctivae normal.      Pupils: Pupils are equal, round, and reactive to light.   Neck:      Musculoskeletal: Normal range of motion and neck supple.      Thyroid: No thyromegaly.      Vascular: No carotid bruit.   Cardiovascular:      Rate and Rhythm: Normal rate and regular rhythm.      Heart sounds: Normal heart sounds. No murmur.   Pulmonary:      Effort: Pulmonary effort is normal.      Breath sounds: Normal breath sounds. No wheezing or rales.   Abdominal:      General: Bowel sounds are normal.      Palpations: Abdomen is soft. There is no mass.      Tenderness: There is no abdominal tenderness.   Musculoskeletal:      Right lower leg: No edema.      Left lower leg: No edema.      Left foot: Normal range of motion. Tenderness present. No swelling.        Feet:    Lymphadenopathy:      Cervical: No cervical adenopathy.   Skin: General: Skin is warm and dry.      Findings: No  rash.      Comments: Dark tan skin     Neurological:      General: No focal deficit present.      Mental Status: She is alert and oriented to person, place, and time.   Psychiatric:         Mood and Affect: Mood normal.         Behavior: Behavior normal.         Thought Content: Thought content normal.         Judgment: Judgment normal.              Assessment and Plan:     I saw Chales Abrahams for routine health maintenance    1. Preventative health care    2. Primary mucinous adenocarcinoma of lung (HCC)    3. Encounter for screening mammogram for breast cancer    4. Dense breast tissue on mammogram    5. Non-toxic multinodular goiter      Problem   Preventative Health Care    Health Maintenance / Prevention  Health Maintenance   Topic Date Due   ??? COLORECTAL CANCER SCREENING  01/01/2019   ??? INFLUENZA VACCINE  06/08/2019   ??? BREAST CANCER SCREENING  06/14/2019   ??? PHYSICAL (COMPREHENSIVE) EXAM  04/18/2020   ??? DTAP/TDAP VACCINES (2 - Td) 12/04/2024   ??? SHINGLES RECOMBINANT VACCINE  Completed   ??? HEPATITIS C SCREENING  Completed     Immunization History   Administered Date(s) Administered   ??? Tdap Vaccine 12/05/2014   ??? Zoster Vaccine Recombinant [facility] - HISTORICAL), Adjuvanted (Shingles) IM 09/08/2017, 02/05/2018     Vaccines -    PNA/PPSV23  -at 65  Prevnar/PCV13    Zoster -Shingrix completed  Influenza - soon  Td/Tdap -UTD  DEXA hip/spine - n/a  Low Dose Chest CT- -already getting with [facility]  Colonoscopy - discussed, cologuard neg 6/17, she desires repeat cologuard, she will check on price and let us know if she wants it ordered  Pap Smear  -s/p hyster benign reasons  Mammogram - normal 06/13/18 but dense breasts, mammo and ABUS ordered for [place]  Lipids -great, reviewed  ASA -n/a  Counseled on age/gender/risk appropriate safety/preventative care items  Depression Screening:  Patient Scores:  PHQ-2: PHQ-2 Score: 0 (04/19/2019 11:43 AM)    PHQ-9: No data recorded Interventions:  PHQ-2: PHQ-2 Score less than 3: No follow-up or recommendations are necessary at this time (04/19/2019 11:43 AM)    Depression Interventions PHQ-2/9: No data recorded       Non-Toxic Multinodular Goiter    Saw endo, thought benign, last [place] 2/19 and recommends follow up US in 2-3 years  Check TSH yearly - good 2020     Primary Mucinous Adenocarcinoma of Lung (Hcc)    Mrs. Aga is a 58 yo female with pmh of tobacco abuse with recently diagnosed mucinous adenocarcinoma of the lung. She was having a calcium score screening done for cardiovascular risk calculation and the imaging showed a mass in her right lower lobe. She was referred to Dr. Ranae Plumber with pulmonary who obtained a PET scan which showed Mild FDG uptake within the right lower lobe pulmonary mass demonstrating maximum SUV of 2.06. Next a CT guided biopsy was done that was not diagnostic of malignancy. On 04/03/16 she was taken for wedge resection with pathologic evaluation concerning for malignancy, so a right lower lobectomy was completed. Pathology showed a 3.5x2.5x2.9cm primary tumor with visceral pleural invasion, 4 lymph nodes sampled  were negative for malignancy. PD-L1 expression was 10%. PT2aN0M0, stage IB.    We discussed the role of adjuvant chemotherapy in reducing her risk of recurrence. Given that her tumor was 3.5cm and had a poor prognostic factor of visceral pleural invasion we are recommending adjuvant cisplatin 75mg /m2 and pemetrexed 500mg /m2 Q 21 days x 4 cycles.     She would like some time to think about her options and discuss with family. She plans to call us with her decision. I have already entered the treatment plan should she decide to go forward with chemotherapy we should start within the next 2 weeks. We did explore the adjuvant clinical trial available, but she does not qualify for them due to tumor being <4cm. If she is not planning on adjuvant chemotherapy we would be happy to see her in 6 months with CT chest for surveillance. Per NCCN guidelines surveillance would consist of H&P, labs and CT w/ contrast every 6 months for 2-3 yrs then annually for 5 yrs.            Tender knots soles of feet  - plantar fascia cyst, morton's neuroma, bony/osteophyte projection, calcifciation?   - does not limit activity, she prefers to just monitor, call if worsening  - got good shoes    Intermittent low back pain   - exercising regularly, soma prn    Recurrent cold sores  - valtrex prn    High sun exposure   - new nevus right thigh  - Dr. Margo Aye retired, she will establish closer to her house    Wrinkles  - on retin-A    I will see her back yearly and sooner as needed.       Orders Placed This Encounter   ??? MAMMO SCREEN BILAT   ??? [place] ABUS EXAM BILAT     Patient Instructions   It was nice to see you today. Thank you for coming into clinic.  I am glad your CT scan was good today.  Your labs look great.  Continue regular exercise.    Your mammogram will be due in October.  See the dermatologist as planned  Call for cologuard coverage and let me know    You may see my nurse practitioner, Luiz Iron, at any time for urgent needs or if I am unavailable.  We are working as a team to provide better continuity and access to our patients.     Follow up with me yearly and sooner as needed.     Routine Clinic Information:  Please don't hesitate to message [place] in MyChart or call if you have any problems or questions. My nurse is Mardella Layman and she can be reached at [phone]. If you don't hear from [place], please follow up as we experience significant call/message volume.     For refills on medications, please have your pharmacy fax a refill authorization request form to our office at Fax) [phone]. Please allow at least 3 business days for refill requests.     For urgent issues after business hours/weekends/holidays call [phone] and request for the outpatient internal medicine physician to be paged. We offer same day appointments for your acute health concerns. These appointments are on a first come, first serve basis. Please call [phone] if you would like to make an appointment. If I am not available, you can see Madelon Lips or any of my partners.     Take care,     Dr. Ramonita Lab  Primary mucinous adenocarcinoma of lung (HCC)  CT scan and oncology follow up today 04/19/19 - no evidence of recurrence. Note emphysema and non-obstructing stone on CT - completely asymptomatic from both aspects, normal PFTs prior to surgery. Will just monitor

## 2019-04-20 NOTE — Assessment & Plan Note
CT scan and oncology follow up today 04/19/19 - no evidence of recurrence. Note emphysema and non-obstructing stone on CT - completely asymptomatic from both aspects, normal PFTs prior to surgery. Will just monitor

## 2019-05-01 ENCOUNTER — Encounter: Admit: 2019-05-01 | Discharge: 2019-05-01

## 2019-05-08 ENCOUNTER — Encounter: Admit: 2019-05-08 | Discharge: 2019-05-08

## 2019-05-08 DIAGNOSIS — Z1211 Encounter for screening for malignant neoplasm of colon: Secondary | ICD-10-CM

## 2019-05-08 DIAGNOSIS — Z0000 Encounter for general adult medical examination without abnormal findings: Secondary | ICD-10-CM

## 2019-05-09 ENCOUNTER — Encounter: Admit: 2019-05-09 | Discharge: 2019-05-09

## 2019-05-25 ENCOUNTER — Encounter: Admit: 2019-05-25 | Discharge: 2019-05-25 | Payer: 59

## 2019-06-04 ENCOUNTER — Encounter: Admit: 2019-06-04 | Discharge: 2019-06-04 | Payer: 59

## 2019-06-05 ENCOUNTER — Encounter

## 2019-06-05 DIAGNOSIS — Z20828 Contact with and (suspected) exposure to other viral communicable diseases: Secondary | ICD-10-CM

## 2019-06-05 DIAGNOSIS — Z1159 Encounter for screening for other viral diseases: Secondary | ICD-10-CM

## 2019-06-07 LAB — COVID-19 (SARS-COV-2) PCR

## 2019-06-08 ENCOUNTER — Encounter: Admit: 2019-06-08 | Discharge: 2019-06-08 | Payer: 59

## 2019-06-19 ENCOUNTER — Encounter: Admit: 2019-06-19 | Discharge: 2019-06-19 | Payer: 59

## 2019-06-19 DIAGNOSIS — Z1211 Encounter for screening for malignant neoplasm of colon: Secondary | ICD-10-CM

## 2019-06-21 ENCOUNTER — Encounter: Admit: 2019-06-21 | Discharge: 2019-06-21 | Payer: 59

## 2019-06-21 DIAGNOSIS — Z1211 Encounter for screening for malignant neoplasm of colon: Secondary | ICD-10-CM

## 2019-06-26 ENCOUNTER — Encounter: Admit: 2019-06-26 | Discharge: 2019-06-26 | Payer: 59

## 2019-06-28 ENCOUNTER — Encounter: Admit: 2019-06-28 | Discharge: 2019-06-28 | Payer: 59

## 2019-06-28 DIAGNOSIS — Z1231 Encounter for screening mammogram for malignant neoplasm of breast: Secondary | ICD-10-CM

## 2019-06-28 DIAGNOSIS — R922 Inconclusive mammogram: Secondary | ICD-10-CM

## 2019-06-28 DIAGNOSIS — Z0000 Encounter for general adult medical examination without abnormal findings: Secondary | ICD-10-CM

## 2019-07-06 ENCOUNTER — Encounter: Admit: 2019-07-06 | Discharge: 2019-07-06 | Payer: 59

## 2019-07-06 DIAGNOSIS — R922 Inconclusive mammogram: Secondary | ICD-10-CM

## 2019-07-06 DIAGNOSIS — Z1231 Encounter for screening mammogram for malignant neoplasm of breast: Secondary | ICD-10-CM

## 2019-07-06 DIAGNOSIS — Z0000 Encounter for general adult medical examination without abnormal findings: Secondary | ICD-10-CM

## 2019-07-09 IMAGING — CR LOW_EXM
3 series · 3 of 3 positions shown · non-contrast
Comparison: none

[knee ap]
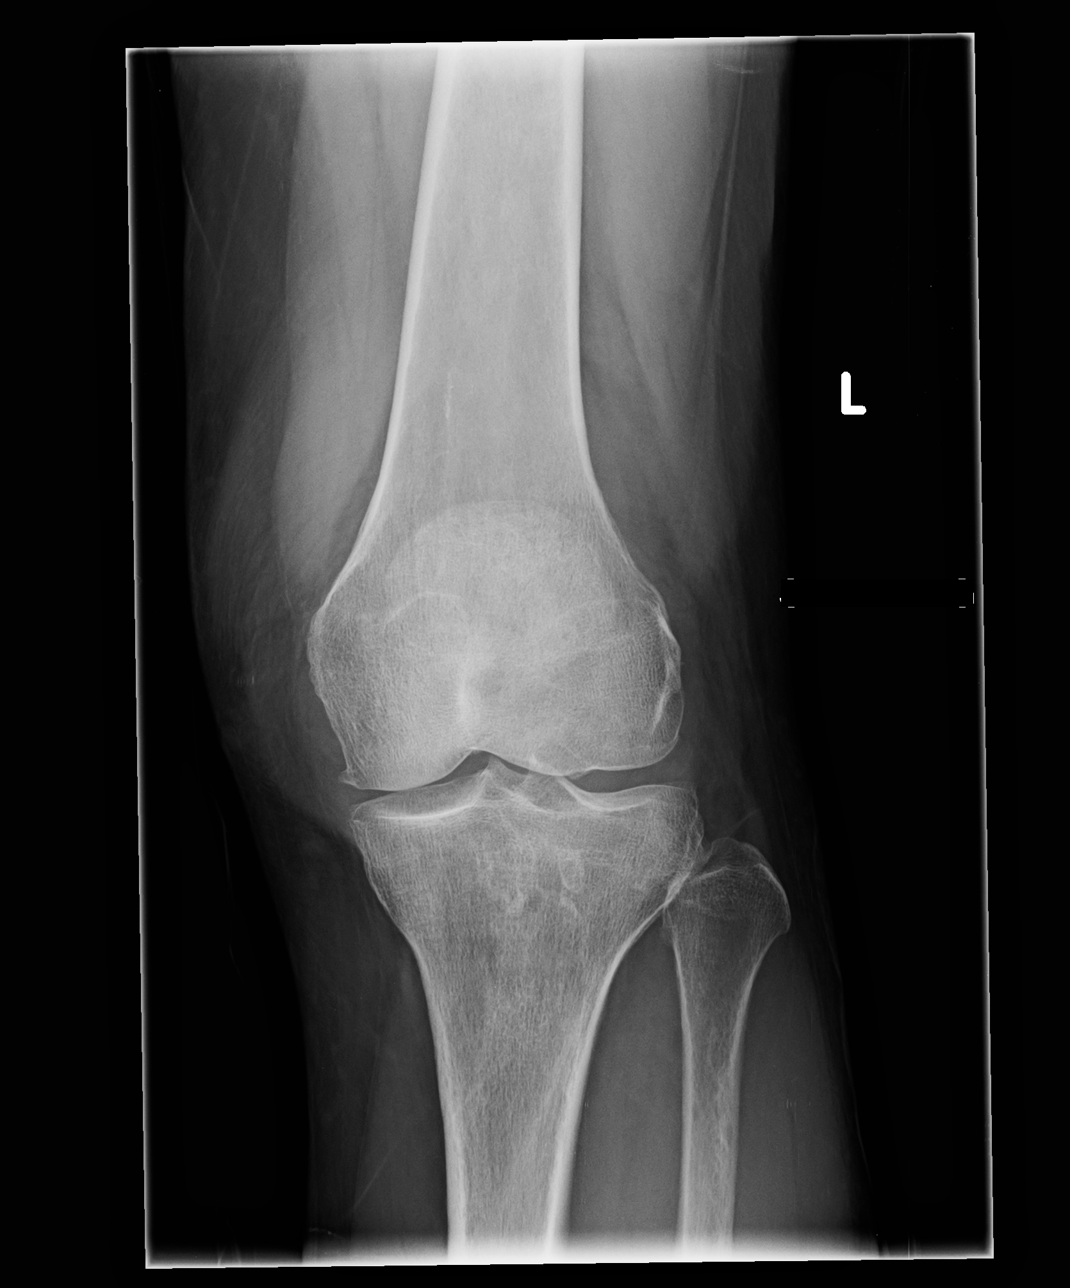

[knee sunrise]
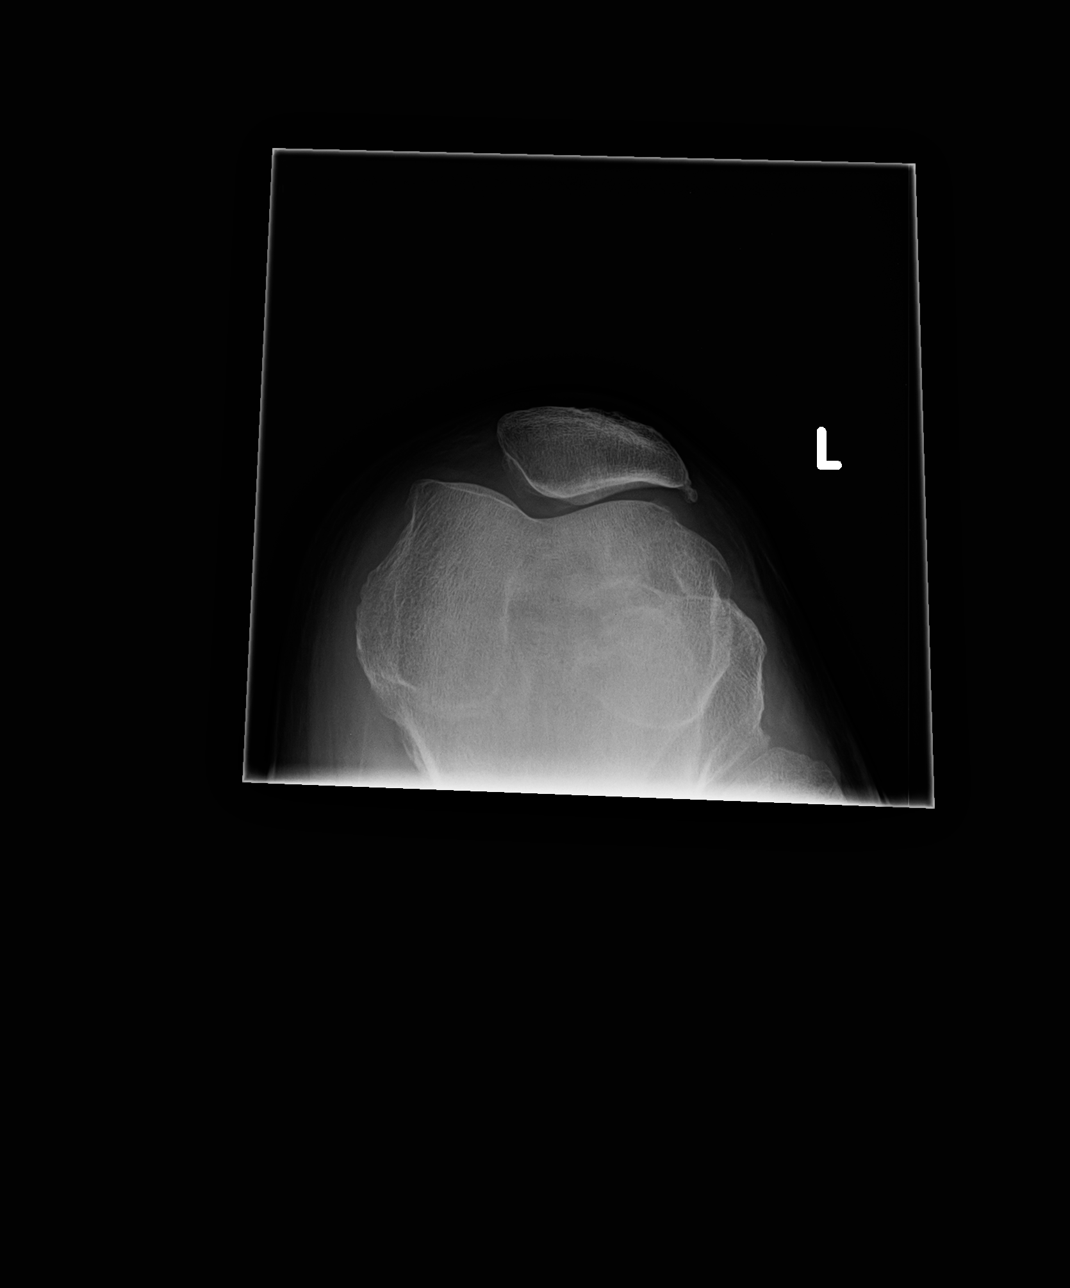

[knee lat]
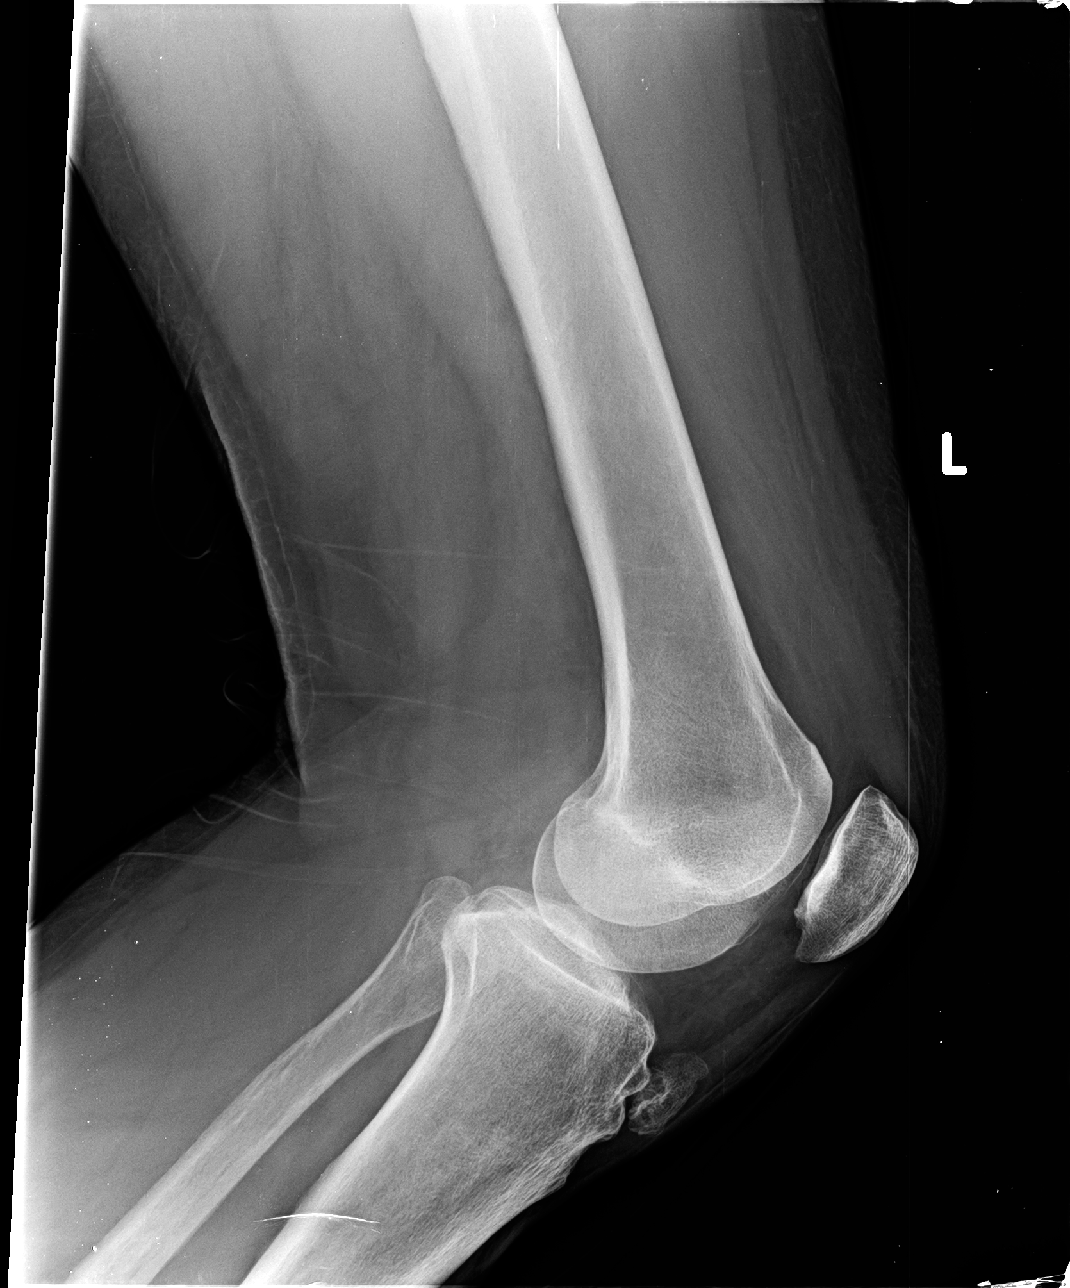

[3 of 3 positions shown; findings below may reference images not displayed]

DIAGNOSTIC STUDIES

EXAM

Left knee radiograph

INDICATION

left knee pain
LEFT KNEE PAIN AND PRESSURE X MONTHS - DENIES PREG- AK

TECHNIQUE

AP, lateral, and sunrise views of the left knee

COMPARISONS

None

FINDINGS

No acute fracture or malalignment. The bones are demineralized. There is mild patellofemoral
compartment narrowing. There are small tricompartmental osteophytes. Chronic fragmentation of the
tibial tubercle may be sequela of Osgood-Schlatter's disease. Small to moderate joint effusion is
noted.

IMPRESSION

No acute osseous abnormality. Small to moderate joint effusion. Mild tricompartmental
osteoarthrosis.

## 2019-07-17 ENCOUNTER — Encounter: Admit: 2019-07-17 | Discharge: 2019-07-17 | Payer: 59

## 2019-07-19 ENCOUNTER — Encounter: Admit: 2019-07-19 | Discharge: 2019-07-19 | Payer: 59

## 2019-07-20 ENCOUNTER — Encounter: Admit: 2019-07-20 | Discharge: 2019-07-20 | Payer: 59

## 2019-08-23 ENCOUNTER — Encounter: Admit: 2019-08-23 | Discharge: 2019-08-23 | Payer: 59

## 2019-08-31 ENCOUNTER — Encounter: Admit: 2019-08-31 | Discharge: 2019-08-31 | Payer: 59

## 2019-10-14 ENCOUNTER — Encounter: Admit: 2019-10-14 | Discharge: 2019-10-14 | Payer: 59

## 2019-10-25 ENCOUNTER — Encounter: Admit: 2019-10-25 | Discharge: 2019-10-25 | Payer: 59

## 2019-11-09 ENCOUNTER — Encounter: Admit: 2019-11-09 | Discharge: 2019-11-09 | Payer: 59

## 2019-11-22 ENCOUNTER — Encounter: Admit: 2019-11-22 | Discharge: 2019-11-22 | Payer: 59

## 2019-11-25 ENCOUNTER — Encounter: Admit: 2019-11-25 | Discharge: 2019-11-25 | Payer: 59

## 2019-12-13 ENCOUNTER — Encounter: Admit: 2019-12-13 | Discharge: 2019-12-13 | Payer: 59

## 2019-12-13 NOTE — Telephone Encounter
Attempted to schedule pt for CT chest. Pt declines to schedule today and will call back to schedule at a later time.

## 2020-01-31 ENCOUNTER — Encounter: Admit: 2020-01-31 | Discharge: 2020-01-31 | Payer: 59

## 2020-01-31 DIAGNOSIS — Z08 Encounter for follow-up examination after completed treatment for malignant neoplasm: Secondary | ICD-10-CM

## 2020-01-31 DIAGNOSIS — C3490 Malignant neoplasm of unspecified part of unspecified bronchus or lung: Secondary | ICD-10-CM

## 2020-02-28 ENCOUNTER — Encounter: Admit: 2020-02-28 | Discharge: 2020-02-28 | Payer: 59

## 2020-02-28 DIAGNOSIS — Z0000 Encounter for general adult medical examination without abnormal findings: Secondary | ICD-10-CM

## 2020-02-28 DIAGNOSIS — E042 Nontoxic multinodular goiter: Secondary | ICD-10-CM

## 2020-03-25 ENCOUNTER — Encounter: Admit: 2020-03-25 | Discharge: 2020-03-25 | Payer: 59

## 2020-03-25 DIAGNOSIS — B001 Herpesviral vesicular dermatitis: Secondary | ICD-10-CM

## 2020-03-25 MED ORDER — VALACYCLOVIR 500 MG PO TAB
ORAL_TABLET | Freq: Two times a day (BID) | 0 refills | Status: AC | PRN
Start: 2020-03-25 — End: ?

## 2020-03-25 NOTE — Telephone Encounter
Pharmacy requesting a script for: valcyclovir 500mg   Last filled: 02/24/2019 #20 X3  LOV: 04/19/2019  NOV: 05/01/2020  Medication not on standing orders.  Routing to Dr. Mardene Speak for approval.   Cherrie Distance, RN

## 2020-05-01 ENCOUNTER — Encounter: Admit: 2020-05-01 | Discharge: 2020-05-01 | Payer: Private Health Insurance - Indemnity

## 2020-05-14 NOTE — Progress Notes
Date of Service: 05/15/2020       Subjective:             Christina Eaton is a 59 y.o. female.      History of Present Illness  Christina Eaton presents to thoracic surgery clinic for her 1 year follow up CT scan of her chest for cancer surveillance. She presented with an incidentally found right lower lobe lung mass.??She then underwent a right thoracoscopic right lower lobectomy and mediastinal lymphadenectomy under the direction of Dr. Bryson Dames on 04/03/16. Pathology confirmed Mucinous adenocarcinoma; Tumor Size: ?3.5 x 2.9 x 2.3 cm Visceral Pleura Invasion Present (supported by the elastic VVG stain); Pathologic Staging (pTNM) pT2aN0Mn/a. ?She did seek medical oncology expert opinion. ?Chemotherpay was offered with the explaination of risks and benefits. ?The patient did not elect to pursue chemotherapy.?    Last CT chest 04/19/19-    1. ?Stable right lower lobectomy changes without evidence of recurrent or   residual right lung mass.   2. ?No evidence of thoracic metastatic disease.   3. ?Moderate emphysema.     Today she reports no procedures, hospitalizations, or problems since our last visit. She reports that she had Covid previously and has occasional productive cough now. She is feeling well and was able to travel to an [place] off of [place] last year. She has annual coming up with PCP shortly. She denies hemoptysis, shortness of air, chest pain, dysphagia, dyspepsia, abdominal pain, change in bowel habits, fevers, chills, night sweats, unintentional weight loss, new headaches, and bone pain.       Christina Eaton had a CT scan completed today that showed:    CT Impression:  1. ?Stable right lower lobectomy changes without evidence of recurrent or   residual right lung mass.     2. ?No evidence of thoracic metastatic disease.     3. ?Moderate emphysema.   This was reviewed at length with Christina Eaton.  A copy of her CT report was given her.       ROS  Review of Systems   Constitutional: Negative.   HENT: Negative.    Eyes: Negative.    Cardiovascular: Negative.    Respiratory: Negative.    Endocrine: Negative.    Hematologic/Lymphatic: Negative.    Skin: Negative.    Musculoskeletal: Negative.    Gastrointestinal: Negative.    Genitourinary: Negative.    Neurological: Negative.    Psychiatric/Behavioral: Negative.      Medical History:   Diagnosis Date   ? Allergy    ? Fibroids     uterine   ? Pulmonary lesion, right      Surgical History:   Procedure Laterality Date   ? Right video assisted THORACOSCOPY, right lower lobectomy Right 04/03/2016    Performed by Bryson Dames, MD at [facility]   ? PARTIAL HYSTERECTOMY  I don't know    fibroids     No Known Allergies  Social History     Socioeconomic History   ? Marital status: Married     Spouse name: Not on file   ? Number of children: Not on file   ? Years of education: Not on file   ? Highest education level: Not on file   Occupational History   ? Not on file   Tobacco Use   ? Smoking status: Former Smoker     Packs/day: 0.50     Quit date: 10/09/2015     Years since quitting: 4.6   ?  Smokeless tobacco: Never Used   ? Tobacco comment: I don't know   Substance and Sexual Activity   ? Alcohol use: Yes     Alcohol/week: 0.0 standard drinks     Comment: occasionally -variable amount depending on the occasion   ? Drug use: No   ? Sexual activity: Not on file   Other Topics Concern   ? Not on file   Social History Narrative    She has been married for 20 years.  She has no children but her husband has some.  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  She loves being active outdoors.  She hikes and walks often.  [place] dogs[place]  [place]     Family History   Problem Relation Age of Onset   ? Cancer-Breast Mother    ? Parkinson's  Father    ? Heart Failure Father    ? Cancer Maternal Aunt          Objective:         ? ergocalciferol (vitamin D2) (VITAMIN D PO) Take  by mouth daily.   ? L-LYSINE PO Take 1,000 mg by mouth daily.   ? MULTIVITAMIN (MULTIPLE VITAMINS PO) Take  by mouth.   ? tretinoin (RETIN-A) 0.1 % topical cream Apply  topically to affected area at bedtime daily.   ? valACYclovir (VALTREX) 500 mg tablet TAKE 1 TABLET BY MOUTH TWICE DAILY FOR 1 DAY AS NEEDED FOR  COLD  SORE  FLARE     Vitals:    05/15/20 0915   BP: 90/68   BP Source: Arm, Left Upper   Patient Position: Sitting   Pulse: 72   Resp: 20   SpO2: 98%   Weight: 62.6 kg (138 lb)   Height: 1.715 m (5' 7.5)   PainSc: Zero     Body mass index is 21.29 kg/m?Marland Kitchen     Physical Exam  Constitutional:       Appearance: She is well-developed.   HENT:      Head: Normocephalic.   Cardiovascular:      Rate and Rhythm: Normal rate and regular rhythm.      Heart sounds: Normal heart sounds.   Pulmonary:      Effort: Pulmonary effort is normal.      Breath sounds: Normal breath sounds.   Musculoskeletal:         General: Normal range of motion.      Cervical back: Neck supple.   Lymphadenopathy:      Cervical: No cervical adenopathy.   Skin:     General: Skin is warm and dry.   Neurological:      Mental Status: She is alert and oriented to person, place, and time.   Psychiatric:         Behavior: Behavior normal.         Thought Content: Thought content normal.         Judgment: Judgment normal.              Assessment and Plan:  1. Primary mucinous adenocarcinoma of lung (HCC)    2. Encounter for follow-up surveillance of lung cancer        Overall, we are very pleased with Christina Eaton progress.  We will plan to continue with radiographic surveillance of the chest in 12  months? time with CT chest w/o per NCCN guidelines. Cindy-Lou Abramo is encouraged to call should any issues or concerns arise.  We would be happy to assist in any way possible.Please do not hesitate to contact us with any questions or concerns.    I spent 30 minutes reviewing medical records and imaging, examining the patient, along with counseling and formulating a plan of care    Jodi Marble, APRN-C  Thoracic Surgery and Lung Cancer Screening

## 2020-05-15 ENCOUNTER — Encounter: Admit: 2020-05-15 | Discharge: 2020-05-15 | Payer: Private Health Insurance - Indemnity

## 2020-05-15 ENCOUNTER — Ambulatory Visit: Admit: 2020-05-15 | Discharge: 2020-05-15 | Payer: Private Health Insurance - Indemnity

## 2020-05-15 DIAGNOSIS — C3490 Malignant neoplasm of unspecified part of unspecified bronchus or lung: Secondary | ICD-10-CM

## 2020-05-15 DIAGNOSIS — Z08 Encounter for follow-up examination after completed treatment for malignant neoplasm: Secondary | ICD-10-CM

## 2020-05-15 DIAGNOSIS — D219 Benign neoplasm of connective and other soft tissue, unspecified: Secondary | ICD-10-CM

## 2020-05-15 DIAGNOSIS — T7840XA Allergy, unspecified, initial encounter: Secondary | ICD-10-CM

## 2020-05-15 DIAGNOSIS — J984 Other disorders of lung: Secondary | ICD-10-CM

## 2020-05-20 ENCOUNTER — Encounter: Admit: 2020-05-20 | Discharge: 2020-05-20 | Payer: Private Health Insurance - Indemnity

## 2020-06-26 ENCOUNTER — Encounter: Admit: 2020-06-26 | Discharge: 2020-06-26 | Payer: Private Health Insurance - Indemnity

## 2020-07-03 ENCOUNTER — Encounter: Admit: 2020-07-03 | Discharge: 2020-07-03 | Payer: Private Health Insurance - Indemnity

## 2020-07-03 ENCOUNTER — Ambulatory Visit: Admit: 2020-07-03 | Discharge: 2020-07-04 | Payer: Private Health Insurance - Indemnity

## 2020-07-03 DIAGNOSIS — Z20822 Encounter for screening laboratory testing for COVID-19 virus: Secondary | ICD-10-CM

## 2020-07-03 DIAGNOSIS — C3490 Malignant neoplasm of unspecified part of unspecified bronchus or lung: Secondary | ICD-10-CM

## 2020-07-03 DIAGNOSIS — Z1231 Encounter for screening mammogram for malignant neoplasm of breast: Secondary | ICD-10-CM

## 2020-07-03 DIAGNOSIS — H538 Other visual disturbances: Secondary | ICD-10-CM

## 2020-07-03 DIAGNOSIS — T7840XA Allergy, unspecified, initial encounter: Secondary | ICD-10-CM

## 2020-07-03 DIAGNOSIS — R7301 Impaired fasting glucose: Secondary | ICD-10-CM

## 2020-07-03 DIAGNOSIS — D219 Benign neoplasm of connective and other soft tissue, unspecified: Secondary | ICD-10-CM

## 2020-07-03 DIAGNOSIS — J984 Other disorders of lung: Secondary | ICD-10-CM

## 2020-07-03 DIAGNOSIS — J3489 Other specified disorders of nose and nasal sinuses: Secondary | ICD-10-CM

## 2020-07-03 DIAGNOSIS — J029 Acute pharyngitis, unspecified: Secondary | ICD-10-CM

## 2020-07-04 ENCOUNTER — Encounter: Admit: 2020-07-04 | Discharge: 2020-07-04 | Payer: Private Health Insurance - Indemnity

## 2020-07-04 ENCOUNTER — Ambulatory Visit: Admit: 2020-07-04 | Discharge: 2020-07-04 | Payer: Private Health Insurance - Indemnity

## 2020-07-04 DIAGNOSIS — Z0000 Encounter for general adult medical examination without abnormal findings: Secondary | ICD-10-CM

## 2020-07-04 DIAGNOSIS — Z20828 Contact with and (suspected) exposure to other viral communicable diseases: Secondary | ICD-10-CM

## 2020-07-04 DIAGNOSIS — Z803 Family history of malignant neoplasm of breast: Secondary | ICD-10-CM

## 2020-07-04 DIAGNOSIS — Z973 Presence of spectacles and contact lenses: Secondary | ICD-10-CM

## 2020-07-04 DIAGNOSIS — Z1231 Encounter for screening mammogram for malignant neoplasm of breast: Secondary | ICD-10-CM

## 2020-07-04 DIAGNOSIS — Z23 Encounter for immunization: Secondary | ICD-10-CM

## 2020-07-04 DIAGNOSIS — H539 Unspecified visual disturbance: Secondary | ICD-10-CM

## 2020-07-05 ENCOUNTER — Encounter: Admit: 2020-07-05 | Discharge: 2020-07-05 | Payer: Private Health Insurance - Indemnity

## 2020-07-05 DIAGNOSIS — B001 Herpesviral vesicular dermatitis: Secondary | ICD-10-CM

## 2020-07-05 MED ORDER — VALACYCLOVIR 500 MG PO TAB
ORAL_TABLET | Freq: Two times a day (BID) | 2 refills | Status: AC | PRN
Start: 2020-07-05 — End: ?

## 2020-07-05 MED ORDER — TRETINOIN 0.1 % TP CREA
Freq: Every evening | 3 refills | Status: AC
Start: 2020-07-05 — End: ?

## 2020-07-05 NOTE — Telephone Encounter
Pharmacy requesting:RetinA topical cream  LR:01/26/2019  LOV:07/03/2020  This is not a standing order. Routing to Dr. Ramonita Lab to approve.   Etta Grandchild, RN

## 2020-07-05 NOTE — Telephone Encounter
Pharmacy requesting:Valacyclovir  LR:03/25/20  LOV:07/03/20  This is not a standing order. Routing to Dr. Ramonita Lab to approve.   Madelin Headings, RN

## 2020-07-07 ENCOUNTER — Encounter: Admit: 2020-07-07 | Discharge: 2020-07-07 | Payer: Private Health Insurance - Indemnity

## 2020-07-11 ENCOUNTER — Encounter: Admit: 2020-07-11 | Discharge: 2020-07-11 | Payer: Private Health Insurance - Indemnity

## 2020-07-12 ENCOUNTER — Encounter: Admit: 2020-07-12 | Discharge: 2020-07-12 | Payer: Private Health Insurance - Indemnity

## 2020-07-17 ENCOUNTER — Encounter: Admit: 2020-07-17 | Discharge: 2020-07-17 | Payer: Private Health Insurance - Indemnity

## 2020-07-17 ENCOUNTER — Ambulatory Visit: Admit: 2020-07-17 | Discharge: 2020-07-17 | Payer: Private Health Insurance - Indemnity

## 2020-07-17 DIAGNOSIS — Z0000 Encounter for general adult medical examination without abnormal findings: Secondary | ICD-10-CM

## 2020-07-17 DIAGNOSIS — R922 Inconclusive mammogram: Secondary | ICD-10-CM

## 2020-07-17 DIAGNOSIS — Z803 Family history of malignant neoplasm of breast: Secondary | ICD-10-CM

## 2020-07-28 ENCOUNTER — Encounter: Admit: 2020-07-28 | Discharge: 2020-07-28 | Payer: Private Health Insurance - Indemnity

## 2020-07-28 MED ORDER — ALPRAZOLAM 0.25 MG PO TAB
.25-.5 mg | ORAL_TABLET | Freq: Two times a day (BID) | ORAL | 0 refills | Status: AC | PRN
Start: 2020-07-28 — End: ?

## 2020-07-29 NOTE — Progress Notes
Xanax ordered for long flight to [place] in Feb

## 2021-03-31 ENCOUNTER — Encounter: Admit: 2021-03-31 | Discharge: 2021-03-31 | Payer: 59

## 2021-04-01 ENCOUNTER — Encounter: Admit: 2021-04-01 | Discharge: 2021-04-01 | Payer: 59

## 2021-05-15 ENCOUNTER — Encounter: Admit: 2021-05-15 | Discharge: 2021-05-15 | Payer: Private Health Insurance - Indemnity

## 2021-05-28 ENCOUNTER — Ambulatory Visit: Admit: 2021-05-28 | Discharge: 2021-05-28 | Payer: Private Health Insurance - Indemnity

## 2021-05-28 ENCOUNTER — Encounter: Admit: 2021-05-28 | Discharge: 2021-05-28 | Payer: Private Health Insurance - Indemnity

## 2021-05-28 DIAGNOSIS — Z0000 Encounter for general adult medical examination without abnormal findings: Secondary | ICD-10-CM

## 2021-05-28 DIAGNOSIS — C3490 Malignant neoplasm of unspecified part of unspecified bronchus or lung: Secondary | ICD-10-CM

## 2021-05-28 DIAGNOSIS — Z08 Encounter for follow-up examination after completed treatment for malignant neoplasm: Secondary | ICD-10-CM

## 2021-05-28 DIAGNOSIS — I998 Other disorder of circulatory system: Secondary | ICD-10-CM

## 2021-05-28 DIAGNOSIS — Z136 Encounter for screening for cardiovascular disorders: Secondary | ICD-10-CM

## 2021-05-28 NOTE — Patient Education
Preliminary Report  CardioScore- Coronary Calcium Score      Name: Christina Eaton  Date of birth: 1961/02/12  Age: 60 y.o.  Date: 05/28/2021   Gender: female    Referring Provider: Dr. Ramonita Lab      The following information is based on an analysis of the coronary (heart) arteries only. A coronary calcium score was computed based on the size and density of calcium deposits in each artery. Coronary calcium deposits do not correspond directly to the percentage of narrowing of the coronary arteries but are markers for underlying coronary atherosclerosis (narrowing of coronary arteries due to plaque). The higher your score, the higher your risk of future cardiac events. These calcium deposits may begin to form years before any symptoms develop. Early detection and risk factor modification such as smoking, and cholesterol intake, can slow the progress of heart disease.    Coronary Artery Calcium Score   (LM) Left Main 0   (LAD) Left Anterior Descending 0   (LCX) Left Circumflex 0   (RCA) Right Coronary Artery 0   (PDA) Posterior Descending Artery 0   Total 0     Your score indicates no identifiable calcification.     0  No identifiable calcification  1-10  Minimal identifiable calcification  11-100  Mild calcification  101-400 Moderate calcification  401+  Significant calcification      DEMOGRAPHIC COMPARISON    Your calcium score is 0, which places you in the 0th percentile for subjects of the same age and gender.     Your percentile rank using the MESA* reference value is 0%. The MESA reference value compares your calcium score to others with the same age (45-84), gender, and race/ethnicity.   *Multi-Ethnic Study of Atherosclerosis - www.mesa-nhlbi.org       ADDITIONAL COMMENTS    This scan is an evaluation of coronary artery calcification and is not intended for any other purpose.     This is a preliminary report. If additional information is noted on your final report, you will be contacted. RECOMMENDATIONS    _X_ Risk Factor Reduction  ? Diet: Eat a balanced meal that consists of a lean protein, vegetables, healthy fat and nutrient dense carbohydrates. Limit saturated and trans fat, red meat, highly processed foods, sweets, and sugar-sweetened beverages. Choose options that include:  - A variety of fruits and vegetables-choose 2 colors at each meal  - Lean proteins  - Low-fat dairy products  - Nuts and legumes  - Heart healthy fats (Extra virgin olive oil and avocado)    ? Exercise: Get 150 minutes of physical activity per week. Physical activity should increase your heart rate, strengthen major muscle groups and increase flexibility. Simple ways to add more activity to your day include walking the dog, taking a family walk, parking farther away, and taking the stairs.    ? Keep cholesterol, blood pressure, blood sugar and weight within normal limits.    ? Manage stress and sleep hygiene.    ? Avoid tobacco products and excess alcohol consumption.    __ Risk factor modification + follow up with your healthcare provider or Cardiologist. Further testing may be considered.     __ Risk factor modification + follow up with a Cardiologist. Further testing is recommended.    _X_ Repeat CardioScore *Consult with your healthcare provider before repeating  __ 3-5 years  _X_ 5 years    Should you ever experience chest pain, difficulty in breathing, discomfort radiating into your neck or  arm, or discomfort combined with lightheadedness, sweating, fainting or nausea, you should seek prompt medical attention.       Reviewed by: Daivd Council    CardioScore Educator/Exercise Specialist  [facility]  Department of Cardiovascular Medicine  [address]  [place], [place] [address]  Phone: [phone]  Email: [email or web address]

## 2021-05-29 ENCOUNTER — Encounter: Admit: 2021-05-29 | Discharge: 2021-05-29 | Payer: Private Health Insurance - Indemnity

## 2021-07-07 ENCOUNTER — Encounter: Admit: 2021-07-07 | Discharge: 2021-07-07 | Payer: Private Health Insurance - Indemnity

## 2021-07-07 DIAGNOSIS — B001 Herpesviral vesicular dermatitis: Secondary | ICD-10-CM

## 2021-07-07 MED ORDER — VALACYCLOVIR 500 MG PO TAB
ORAL_TABLET | Freq: Two times a day (BID) | 0 refills | Status: AC | PRN
Start: 2021-07-07 — End: ?

## 2021-07-07 NOTE — Telephone Encounter
Some refill protocol elements NOT Met  Medication name: Valtrex  Medication Strength: 522m tablet      Office visit due   Valid encounter within last 12 months    Next office visit: 09/17/21.    Provided courtesy refill.    LTruitt Leep RN

## 2021-07-18 ENCOUNTER — Encounter: Admit: 2021-07-18 | Discharge: 2021-07-18 | Payer: Private Health Insurance - Indemnity

## 2021-07-21 ENCOUNTER — Encounter: Admit: 2021-07-21 | Discharge: 2021-07-21 | Payer: Private Health Insurance - Indemnity

## 2021-07-30 ENCOUNTER — Encounter: Admit: 2021-07-30 | Discharge: 2021-07-30 | Payer: Private Health Insurance - Indemnity

## 2021-07-30 DIAGNOSIS — B001 Herpesviral vesicular dermatitis: Secondary | ICD-10-CM

## 2021-07-30 MED ORDER — VALACYCLOVIR 500 MG PO TAB
ORAL_TABLET | Freq: Two times a day (BID) | 0 refills | Status: AC | PRN
Start: 2021-07-30 — End: ?

## 2021-07-30 NOTE — Telephone Encounter
Some refill protocol elements NOT Met  Medication name: Valtrex  Medication Strength: 541m tablet      Office visit due   Valid encounter within last 12 months    Next office visit: 09/17/21.    Provided courtesy refill: 20 tablets.     LTruitt Leep RN

## 2021-08-29 ENCOUNTER — Encounter: Admit: 2021-08-29 | Discharge: 2021-08-29 | Payer: Private Health Insurance - Indemnity

## 2021-08-29 DIAGNOSIS — B001 Herpesviral vesicular dermatitis: Secondary | ICD-10-CM

## 2021-08-29 MED ORDER — VALACYCLOVIR 500 MG PO TAB
ORAL_TABLET | Freq: Two times a day (BID) | 0 refills | Status: AC | PRN
Start: 2021-08-29 — End: ?

## 2021-08-29 NOTE — Telephone Encounter
Some refill protocol elements NOT Met  Medication name: Valtrex  Medication Strength: 516m tablet      Office visit due   Valid encounter within last 12 months    Next office visit: 10/01/21.    Provided courtesy refill: 20 tablets.     LTruitt Leep RN

## 2021-09-03 ENCOUNTER — Encounter: Admit: 2021-09-03 | Discharge: 2021-09-03 | Payer: Private Health Insurance - Indemnity

## 2021-09-03 DIAGNOSIS — Z1231 Encounter for screening mammogram for malignant neoplasm of breast: Secondary | ICD-10-CM

## 2021-09-03 DIAGNOSIS — C3490 Malignant neoplasm of unspecified part of unspecified bronchus or lung: Secondary | ICD-10-CM

## 2021-09-03 DIAGNOSIS — Z0000 Encounter for general adult medical examination without abnormal findings: Secondary | ICD-10-CM

## 2021-09-03 DIAGNOSIS — E042 Nontoxic multinodular goiter: Secondary | ICD-10-CM

## 2021-09-03 DIAGNOSIS — R922 Inconclusive mammogram: Secondary | ICD-10-CM

## 2021-09-03 DIAGNOSIS — Z803 Family history of malignant neoplasm of breast: Secondary | ICD-10-CM

## 2021-09-03 NOTE — Telephone Encounter
Labs were Sept 2021 so I ordered everything except Vit D (it was great last time). Please send them to her. There are 2 openings next Wed if she wants to get in sooner- Jan 4 for her physical.  Thanks

## 2021-09-04 ENCOUNTER — Encounter: Admit: 2021-09-04 | Discharge: 2021-09-04 | Payer: Private Health Insurance - Indemnity

## 2021-09-10 ENCOUNTER — Ambulatory Visit: Admit: 2021-09-10 | Discharge: 2021-09-10 | Payer: Private Health Insurance - Indemnity

## 2021-09-10 ENCOUNTER — Encounter: Admit: 2021-09-10 | Discharge: 2021-09-10 | Payer: Private Health Insurance - Indemnity

## 2021-09-10 DIAGNOSIS — Z803 Family history of malignant neoplasm of breast: Secondary | ICD-10-CM

## 2021-09-10 DIAGNOSIS — Z1231 Encounter for screening mammogram for malignant neoplasm of breast: Secondary | ICD-10-CM

## 2021-09-10 DIAGNOSIS — R922 Inconclusive mammogram: Secondary | ICD-10-CM

## 2021-09-26 ENCOUNTER — Encounter: Admit: 2021-09-26 | Discharge: 2021-09-26 | Payer: Private Health Insurance - Indemnity

## 2021-09-26 DIAGNOSIS — B001 Herpesviral vesicular dermatitis: Secondary | ICD-10-CM

## 2021-09-26 MED ORDER — VALACYCLOVIR 500 MG PO TAB
ORAL_TABLET | Freq: Two times a day (BID) | 0 refills | Status: AC | PRN
Start: 2021-09-26 — End: ?

## 2021-10-01 ENCOUNTER — Encounter: Admit: 2021-10-01 | Discharge: 2021-10-01 | Payer: Private Health Insurance - Indemnity

## 2021-10-01 ENCOUNTER — Ambulatory Visit: Admit: 2021-10-01 | Discharge: 2021-10-01 | Payer: Private Health Insurance - Indemnity

## 2021-10-01 DIAGNOSIS — C3490 Malignant neoplasm of unspecified part of unspecified bronchus or lung: Secondary | ICD-10-CM

## 2021-10-01 DIAGNOSIS — D219 Benign neoplasm of connective and other soft tissue, unspecified: Secondary | ICD-10-CM

## 2021-10-01 DIAGNOSIS — B001 Herpesviral vesicular dermatitis: Secondary | ICD-10-CM

## 2021-10-01 DIAGNOSIS — T7840XA Allergy, unspecified, initial encounter: Secondary | ICD-10-CM

## 2021-10-01 DIAGNOSIS — Z23 Encounter for immunization: Secondary | ICD-10-CM

## 2021-10-01 DIAGNOSIS — Z0000 Encounter for general adult medical examination without abnormal findings: Secondary | ICD-10-CM

## 2021-10-01 DIAGNOSIS — J984 Other disorders of lung: Secondary | ICD-10-CM

## 2021-10-01 MED ORDER — VALACYCLOVIR 500 MG PO TAB
Freq: Two times a day (BID) | 0 refills | Status: AC | PRN
Start: 2021-10-01 — End: ?

## 2021-10-01 NOTE — Patient Instructions
It was nice to see you today. Thank you for coming into clinic.    Please go to the lab on the first floor, radiology on the 2nd floor at your convenience (orders are electronic).     The following testing has been ordered to be done at your convenience:    The following referral is in place for you to schedule an appoinment at your convenience:  If you do not hear about your referral appointment, please let us know so that we can follow up on it.  You can always call the operator at [phone] and ask for that dept to schedule.     You may see my nurse practitioner, Caffrey Dudenhoeffer, at any time for urgent needs or if I am unavailable.  We are working as a team to provide better continuity and access to our patients.     Follow up with me in months or sooner as needed.     Routine Clinic Information:  Please don't hesitate to message us in MyChart or call if you have any problems or questions. My nurse is Angela and she can be reached at [phone]. If you don't hear from us, please follow up as we experience significant call/message volume.     For refills on medications, please have your pharmacy fax a refill authorization request form to our office at Fax) [phone]. Please allow at least 3 business days for refill requests.     For urgent issues after business hours/weekends/holidays call [phone] and request for the outpatient internal medicine physician to be paged.     We offer same day appointments for your acute health concerns. These appointments are on a first come, first serve basis. Please call [phone] if you would like to make an appointment. If I am not available, you can see Caffrey or any of my partners.     Take care,     Dr. Beckam Abdulaziz

## 2021-10-13 ENCOUNTER — Encounter: Admit: 2021-10-13 | Discharge: 2021-10-13 | Payer: Private Health Insurance - Indemnity

## 2021-11-08 ENCOUNTER — Encounter: Admit: 2021-11-08 | Discharge: 2021-11-08 | Payer: Private Health Insurance - Indemnity

## 2021-11-08 DIAGNOSIS — B001 Herpesviral vesicular dermatitis: Secondary | ICD-10-CM

## 2021-11-08 MED ORDER — VALACYCLOVIR 500 MG PO TAB
ORAL_TABLET | 0 refills | Status: AC
Start: 2021-11-08 — End: ?

## 2021-12-03 ENCOUNTER — Encounter: Admit: 2021-12-03 | Discharge: 2021-12-03 | Payer: BC Managed Care – PPO

## 2021-12-17 ENCOUNTER — Encounter: Admit: 2021-12-17 | Discharge: 2021-12-17 | Payer: BC Managed Care – PPO

## 2021-12-24 ENCOUNTER — Encounter: Admit: 2021-12-24 | Discharge: 2021-12-24 | Payer: BC Managed Care – PPO

## 2021-12-24 DIAGNOSIS — B001 Herpesviral vesicular dermatitis: Secondary | ICD-10-CM

## 2021-12-24 MED ORDER — VALACYCLOVIR 500 MG PO TAB
ORAL_TABLET | 0 refills | Status: AC
Start: 2021-12-24 — End: ?

## 2021-12-24 NOTE — Telephone Encounter
1. Received e-request from [facility] requesting new Rx for Valtrex.      2. Last Rx written:     Disp Refills Start End    valACYclovir (VALTREX) 500 mg tablet 20 tablet 0 11/08/2021     Sig: TAKE 1 TABLET BY MOUTH TWICE DAILY FOR 1 DAY AS NEEDED FOR COLD SORE FLARE    Sent to pharmacy as: valACYclovir 500 mg tablet (VALTREX)    E-Prescribing Status: Receipt confirmed by pharmacy (11/08/2021 1:47 PM CST)              3. Last Gen Med Visit:    10/01/21        4. Next Appt due:    unspecified        No future appointments.        5. All protocol criteria met? Yes              \

## 2022-01-10 ENCOUNTER — Encounter: Admit: 2022-01-10 | Discharge: 2022-01-10 | Payer: BC Managed Care – PPO

## 2022-01-10 DIAGNOSIS — B001 Herpesviral vesicular dermatitis: Secondary | ICD-10-CM

## 2022-01-10 MED ORDER — VALACYCLOVIR 500 MG PO TAB
ORAL_TABLET | 0 refills | Status: AC
Start: 2022-01-10 — End: ?

## 2022-01-28 ENCOUNTER — Encounter: Admit: 2022-01-28 | Discharge: 2022-01-28 | Payer: BC Managed Care – PPO

## 2022-01-28 DIAGNOSIS — B001 Herpesviral vesicular dermatitis: Secondary | ICD-10-CM

## 2022-01-28 MED ORDER — VALACYCLOVIR 500 MG PO TAB
ORAL_TABLET | 0 refills | Status: AC
Start: 2022-01-28 — End: ?

## 2022-01-28 NOTE — Telephone Encounter
1. Received e-request from [facility] requesting new Rx for Valtrex.      2. Last Rx written:     Disp Refills Start End    valACYclovir (VALTREX) 500 mg tablet 20 tablet 0 01/10/2022     Sig: TAKE 1 TABLET BY MOUTH TWICE DAILY FOR 1 DAY AS NEEDED FOR COLD SORE FLARE    Sent to pharmacy as: valACYclovir 500 mg tablet (VALTREX)    E-Prescribing Status: Receipt confirmed by pharmacy (01/10/2022 9:48 AM CDT)              3. Last Gen Med Visit:    10/01/21        4. Next Appt due:    unspecified        No future appointments.        5. All protocol criteria met? Yes

## 2022-03-04 ENCOUNTER — Encounter: Admit: 2022-03-04 | Discharge: 2022-03-04 | Payer: BC Managed Care – PPO

## 2022-03-18 ENCOUNTER — Encounter: Admit: 2022-03-18 | Discharge: 2022-03-18 | Payer: BC Managed Care – PPO

## 2022-03-20 ENCOUNTER — Encounter: Admit: 2022-03-20 | Discharge: 2022-03-20 | Payer: BC Managed Care – PPO

## 2022-03-20 DIAGNOSIS — E041 Nontoxic single thyroid nodule: Secondary | ICD-10-CM

## 2022-03-27 ENCOUNTER — Encounter: Admit: 2022-03-27 | Discharge: 2022-03-27 | Payer: BC Managed Care – PPO

## 2022-03-27 DIAGNOSIS — B001 Herpesviral vesicular dermatitis: Secondary | ICD-10-CM

## 2022-03-27 MED ORDER — VALACYCLOVIR 500 MG PO TAB
ORAL_TABLET | 0 refills | Status: AC
Start: 2022-03-27 — End: ?

## 2022-04-06 ENCOUNTER — Encounter: Admit: 2022-04-06 | Discharge: 2022-04-06 | Payer: BC Managed Care – PPO

## 2022-04-13 ENCOUNTER — Encounter: Admit: 2022-04-13 | Discharge: 2022-04-13 | Payer: BC Managed Care – PPO

## 2022-04-13 MED ORDER — TRETINOIN 0.1 % TP CREA
1 g | Freq: Every evening | TOPICAL | 3 refills | Status: AC
Start: 2022-04-13 — End: ?

## 2022-06-15 ENCOUNTER — Encounter: Admit: 2022-06-15 | Discharge: 2022-06-15 | Payer: BC Managed Care – PPO

## 2022-06-15 MED ORDER — ALPRAZOLAM 0.25 MG PO TAB
ORAL_TABLET | 0 refills
Start: 2022-06-15 — End: ?

## 2022-06-15 NOTE — Telephone Encounter
The original prescription was discontinued on 10/01/2021 by Polsak, Micholee B, DO. Renewing this prescription may not be appropriate.    Last filled one year ago.

## 2022-06-27 ENCOUNTER — Encounter: Admit: 2022-06-27 | Discharge: 2022-06-27 | Payer: BC Managed Care – PPO

## 2022-06-27 DIAGNOSIS — B001 Herpesviral vesicular dermatitis: Secondary | ICD-10-CM

## 2022-06-27 MED ORDER — VALACYCLOVIR 500 MG PO TAB
ORAL_TABLET | 0 refills | Status: AC
Start: 2022-06-27 — End: ?

## 2022-06-29 ENCOUNTER — Encounter: Admit: 2022-06-29 | Discharge: 2022-06-29 | Payer: BC Managed Care – PPO

## 2022-06-29 DIAGNOSIS — Z08 Encounter for follow-up examination after completed treatment for malignant neoplasm: Secondary | ICD-10-CM

## 2022-06-29 DIAGNOSIS — C3490 Malignant neoplasm of unspecified part of unspecified bronchus or lung: Secondary | ICD-10-CM

## 2022-07-02 ENCOUNTER — Encounter: Admit: 2022-07-02 | Discharge: 2022-07-02 | Payer: BC Managed Care – PPO

## 2022-07-03 NOTE — Progress Notes
Date of Service: 07/06/2022       Subjective:             Christina Eaton is a 61 y.o. female.      History of Present Illness  Christina Eaton presents to thoracic surgery clinic for her 1 year follow up CT scan of her chest for cancer surveillance. She presented with an incidentally found right lower lobe lung mass.??She then underwent a right thoracoscopic right lower lobectomy and mediastinal lymphadenectomy under the direction of Dr. Bryson Dames on 04/03/16. Pathology confirmed Mucinous adenocarcinoma; Tumor Size: ?3.5 x 2.9 x 2.3 cm Visceral Pleura Invasion Present (supported by the elastic VVG stain); Pathologic Staging (pTNM) pT2aN0Mn/a. ?She did seek medical oncology expert opinion. ?Chemotherpay was offered with the explaination of risks and benefits. ?The patient did not elect to pursue chemotherapy.?    Last CT 05/28/21- 1. ?Right lower lobectomy without evidence of recurrent or residual right lung mass. 2. ?No thoracic lymphadenopathy. 3. ?Mild emphysema. 4. ?At least mild aortic valve and coronary artery calcification.     No Medical changes, traveling for fun, 3 trips coming up, husband 14 yrs older,  uses peloton app weights/yoga daily, physically very active. Breathing is great. No new neurologic changes.      Christina Eaton had a CT scan completed 07/01/22 external that showed:    CT Impression:         ROS  Constitutional: Negative.    HENT: Negative.    Eyes: Negative.    Respiratory: Negative.    Cardiovascular: Negative.    Gastrointestinal: Negative.    Endocrine: Negative.    Genitourinary: Negative.    Musculoskeletal: Negative.    Skin: Negative.    Allergic/Immunologic: Negative.    Neurological: Negative.    Hematological: Negative.    Psychiatric/Behavioral: Negative.    ?  ?  Medical History:   Diagnosis Date   ? Allergy    ? Fibroids     uterine   ? Pulmonary lesion, right      Surgical History:   Procedure Laterality Date   ? Right video assisted THORACOSCOPY, right lower lobectomy Right 04/03/2016    Performed by [facility]ames[facility] at [facility]   ? HX LOBECTOMY     ? PARTIAL HYSTERECTOMY  I don't know    fibroids     No Known Allergies  Social History     Socioeconomic History   ? Marital status: Married   Tobacco Use   ? Smoking status: Former     Packs/day: 0     Types: Cigarettes     Quit date: 10/09/2015     Years since quitting: 6.7   ? Smokeless tobacco: Never   ? Tobacco comments:     I don't know   Substance and Sexual Activity   ? Alcohol use: Yes     Comment: occasionally -variable amount depending on the occasion   ? Drug use: No   ? Sexual activity: Yes     Partners: Female     Birth control/protection: None   Social History Narrative    She has been married for 20 years.  She has no children but her husband has some.  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  She loves being active outdoors.  She hikes and walks often.  [place]  [place]     Family History   Problem Relation Age of Onset   ? Cancer-Breast Mother    ? Parkinson's  Father    ? Heart Failure Father    ? Cancer Maternal Aunt         Breast         Objective:         ? ALPRAZolam (XANAX) 0.25 mg tablet TAKE 1 TO 2 TABLETS BY MOUTH TWICE DAILY AS NEEDED FOR ANXIETY FOR FLIGHT   ? ergocalciferol (vitamin D2) (VITAMIN D PO) Take  by mouth daily.   ? L-LYSINE PO Take 1,000 mg by mouth daily.   ? MULTIVITAMIN (MULTIPLE VITAMINS PO) Take  by mouth.   ? tretinoin (RETIN-A) 0.1 % topical cream Apply one g topically to affected area at bedtime daily.   ? valACYclovir (VALTREX) 500 mg tablet TAKE 1 TABLET BY MOUTH TWICE DAILY FOR  1  DAY  AS  NEEDED  FOR  COLD  SORE  FLARE         Physical Exam  Constitutional:       Appearance: Normal appearance.   HENT:      Nose: Nose normal.   Pulmonary:      Effort: Pulmonary effort is normal.   Neurological:      Mental Status: She is alert.              Assessment and Plan:  1. Primary mucinous adenocarcinoma of lung (HCC)  CT CHEST WO CONTRAST      2. Encounter for follow-up surveillance of lung cancer  CT CHEST WO CONTRAST          No evidence of recurrence.     Plan:   Return to clinic in 1 year for continued lung cancer surveillance with CT.       Micheal Likens, APRN  Cardiothoracic Surgery

## 2022-07-06 ENCOUNTER — Encounter: Admit: 2022-07-06 | Discharge: 2022-07-06 | Payer: BC Managed Care – PPO

## 2022-07-06 ENCOUNTER — Ambulatory Visit: Admit: 2022-07-06 | Discharge: 2022-07-07 | Payer: BC Managed Care – PPO

## 2022-07-06 DIAGNOSIS — D219 Benign neoplasm of connective and other soft tissue, unspecified: Secondary | ICD-10-CM

## 2022-07-06 DIAGNOSIS — T7840XA Allergy, unspecified, initial encounter: Secondary | ICD-10-CM

## 2022-07-06 DIAGNOSIS — Z08 Encounter for follow-up examination after completed treatment for malignant neoplasm: Secondary | ICD-10-CM

## 2022-07-06 DIAGNOSIS — J984 Other disorders of lung: Secondary | ICD-10-CM

## 2022-07-06 DIAGNOSIS — C3490 Malignant neoplasm of unspecified part of unspecified bronchus or lung: Secondary | ICD-10-CM

## 2022-08-09 ENCOUNTER — Encounter: Admit: 2022-08-09 | Discharge: 2022-08-09 | Payer: BC Managed Care – PPO

## 2022-08-09 MED ORDER — CEPHALEXIN 500 MG PO CAP
500 mg | ORAL_CAPSULE | Freq: Two times a day (BID) | ORAL | 0 refills | Status: AC
Start: 2022-08-09 — End: ?

## 2022-08-09 NOTE — Progress Notes
Christina Eaton was here at urgent care with her husband.  She complains of fairly typical UTI symptoms frequency urgency hesitancy and urinary dip today shows leukocytes and trace nitrites    She is already taken some of [name]'s Keflex for couple days and the urinary symptoms seem to have a improved.  I did empirically give her 3 more days of Keflex 500 mg twice daily.  If not  completely resolved, she should be seen by her primary care doctor

## 2022-08-18 ENCOUNTER — Encounter: Admit: 2022-08-18 | Discharge: 2022-08-18 | Payer: BC Managed Care – PPO

## 2022-08-18 MED ORDER — ALPRAZOLAM 0.25 MG PO TAB
ORAL_TABLET | 0 refills | Status: AC
Start: 2022-08-18 — End: ?

## 2022-08-18 NOTE — Telephone Encounter
Some refill protocol elements NOT Met  Medication name: Xanax  Medication Strength: 0.24m tablet      Off protocol and Office visit due   Valid encounter within last 6 months    This refill cannot be delegated     Next office visit: 10/05/22.    Routed to Provider    LTruitt Leep RN

## 2022-09-09 ENCOUNTER — Encounter: Admit: 2022-09-09 | Discharge: 2022-09-09 | Payer: BC Managed Care – PPO

## 2022-09-11 ENCOUNTER — Encounter: Admit: 2022-09-11 | Discharge: 2022-09-11 | Payer: BC Managed Care – PPO

## 2022-09-11 ENCOUNTER — Ambulatory Visit: Admit: 2022-09-11 | Discharge: 2022-09-11 | Payer: BC Managed Care – PPO

## 2022-09-11 DIAGNOSIS — S93601A Unspecified sprain of right foot, initial encounter: Secondary | ICD-10-CM

## 2022-09-11 DIAGNOSIS — D219 Benign neoplasm of connective and other soft tissue, unspecified: Secondary | ICD-10-CM

## 2022-09-11 DIAGNOSIS — T7840XA Allergy, unspecified, initial encounter: Secondary | ICD-10-CM

## 2022-09-11 DIAGNOSIS — J984 Other disorders of lung: Secondary | ICD-10-CM

## 2022-09-11 NOTE — Progress Notes
Date of Service: 09/11/2022    Christina Eaton is a 62 y.o. female.  DOB: [birth year]  MRN: [id number]     Subjective:             History of Present Illness  Generally healthy 62 year old with right foot pain.  She twisted her foot mechanism unclear well hiking rough ground while in [place] on the 27th about 10 days ago.  Pain is proximal is gotten better but she is very concerned that she is going to be traveling again.       Review of Systems  No other injuries  Osteoporosis history.  Otherwise quite active and no chronic major illnesses  Objective:          ALPRAZolam (XANAX) 0.25 mg tablet TAKE 1 TO 2 TABLETS BY MOUTH TWICE DAILY AS NEEDED FOR ANXIETY FOR FLIGHT    ergocalciferol (vitamin D2) (VITAMIN D PO) Take  by mouth daily.    L-LYSINE PO Take 1,000 mg by mouth daily.    MULTIVITAMIN (MULTIPLE VITAMINS PO) Take  by mouth.    tretinoin (RETIN-A) 0.1 % topical cream Apply one g topically to affected area at bedtime daily.    valACYclovir (VALTREX) 500 mg tablet TAKE 1 TABLET BY MOUTH TWICE DAILY FOR  1  DAY  AS  NEEDED  FOR  COLD  SORE  FLARE     Vitals:    09/11/22 1300   BP: 132/84   BP Source: Arm, Right Upper   Pulse: 103   Temp: 36.6 ?C (97.8 ?F)   Resp: 16   SpO2: 98%   TempSrc: Oral   PainSc: Four   Weight: 62 kg (136 lb 9.6 oz)   Height: 165.1 cm (5' 5)     Body mass index is 22.73 kg/m?Marland Kitchen     Physical Exam  Looks well.  No significant limp.  Pointing to the lower deltoid ligament area and in the lateral aspect of the proximal foot without gross swelling or deformity there.  X-rays again were negative.  Ankle is benign neurovascular intact.  No gross swelling or laxity.       Assessment and Plan:  Christina Eaton was seen today for foot injury.    Diagnoses and all orders for this visit:    Sprain of right foot, initial encounter  -     FOOT COMP MIN 3 VIEWS RIGHT; Future; Expected date: 09/11/2022          Sprain right foot clinically improving.  Christina Eaton is concerned about an upcoming trip to be ready for that, but that is a month away.  I advised her this should continue to improve but could be reevaluated rex-rayed reimaged and sent to orthopedics if necessary.  After visit handout regarding foot sprains reviewed       X-ray overread shows no fracture

## 2022-09-12 NOTE — Progress Notes
Your X-Ray final read was negative for fractures. No change in the plan of care. Please follow up with your PCP if further concerns.

## 2022-09-14 ENCOUNTER — Encounter: Admit: 2022-09-14 | Discharge: 2022-09-14 | Payer: BC Managed Care – PPO

## 2022-10-05 ENCOUNTER — Encounter: Admit: 2022-10-05 | Discharge: 2022-10-05 | Payer: BC Managed Care – PPO

## 2022-10-05 ENCOUNTER — Ambulatory Visit: Admit: 2022-10-05 | Discharge: 2022-10-06 | Payer: BC Managed Care – PPO

## 2022-10-05 DIAGNOSIS — N958 Other specified menopausal and perimenopausal disorders: Secondary | ICD-10-CM

## 2022-10-05 DIAGNOSIS — R92333 Heterogeneously dense tissue of both breasts on mammography: Secondary | ICD-10-CM

## 2022-10-05 DIAGNOSIS — Z803 Family history of malignant neoplasm of breast: Secondary | ICD-10-CM

## 2022-10-05 DIAGNOSIS — J984 Other disorders of lung: Secondary | ICD-10-CM

## 2022-10-05 DIAGNOSIS — Z0000 Encounter for general adult medical examination without abnormal findings: Secondary | ICD-10-CM

## 2022-10-05 DIAGNOSIS — F419 Anxiety disorder, unspecified: Secondary | ICD-10-CM

## 2022-10-05 DIAGNOSIS — E041 Nontoxic single thyroid nodule: Secondary | ICD-10-CM

## 2022-10-05 DIAGNOSIS — Z1231 Encounter for screening mammogram for malignant neoplasm of breast: Secondary | ICD-10-CM

## 2022-10-05 DIAGNOSIS — Z87440 Personal history of urinary (tract) infections: Secondary | ICD-10-CM

## 2022-10-05 DIAGNOSIS — R300 Dysuria: Secondary | ICD-10-CM

## 2022-10-05 DIAGNOSIS — C3490 Malignant neoplasm of unspecified part of unspecified bronchus or lung: Secondary | ICD-10-CM

## 2022-10-05 DIAGNOSIS — D219 Benign neoplasm of connective and other soft tissue, unspecified: Secondary | ICD-10-CM

## 2022-10-05 DIAGNOSIS — N898 Other specified noninflammatory disorders of vagina: Secondary | ICD-10-CM

## 2022-10-05 DIAGNOSIS — T7840XA Allergy, unspecified, initial encounter: Secondary | ICD-10-CM

## 2022-10-05 LAB — DIRECT EXAM (WET PREP)

## 2022-10-05 LAB — URINALYSIS DIPSTICK REFLEX TO CULTURE
NITRITE: NEGATIVE
URINE ASCORBIC ACID, UA: POSITIVE — AB
URINE BILE: NEGATIVE
URINE BLOOD: NEGATIVE
URINE KETONE: NEGATIVE

## 2022-10-05 LAB — URINALYSIS MICROSCOPIC REFLEX TO CULTURE

## 2022-10-05 MED ORDER — ALPRAZOLAM 0.25 MG PO TAB
.25 mg | ORAL_TABLET | Freq: Every day | ORAL | 0 refills | Status: AC | PRN
Start: 2022-10-05 — End: ?

## 2022-10-05 NOTE — Patient Instructions
It was nice to see you today. Thank you for coming into clinic.    We will see if your urine shows there is an infection.    The following testing has been ordered to be done at your convenience: Mammogram, automated breast ultrasound and thyroid ultrasound    Continue to ice your ankle.  You may take a brace with you on the trip.    You may see my nurse practitioner, Glenford Peers, at any time for urgent needs or if I am unavailable.  We are working as a team to provide better continuity and access to our patients.     Follow up with me yearly and sooner as needed.     Routine Clinic Information:  Please don't hesitate to message [place] in MyChart or call if you have any problems or questions. My nurse is Levada Dy and she can be reached at [phone]. If you don't hear from [place], please follow up as we experience significant call/message volume.     For refills on medications, please have your pharmacy fax a refill authorization request form to our office at Fax) [phone]. Please allow at least 3 business days for refill requests.     For urgent issues after business hours/weekends/holidays call [phone] and request for the outpatient internal medicine physician to be paged.     We offer same day appointments for your acute health concerns. These appointments are on a first come, first serve basis. Please call [phone] if you would like to make an appointment. If I am not available, you can see French Ana or any of my partners.     Take care,     Dr. Star Age

## 2022-10-06 DIAGNOSIS — Z1211 Encounter for screening for malignant neoplasm of colon: Secondary | ICD-10-CM

## 2022-10-06 DIAGNOSIS — N941 Unspecified dyspareunia: Secondary | ICD-10-CM

## 2022-10-08 ENCOUNTER — Encounter: Admit: 2022-10-08 | Discharge: 2022-10-08 | Payer: BC Managed Care – PPO

## 2022-10-08 DIAGNOSIS — B001 Herpesviral vesicular dermatitis: Secondary | ICD-10-CM

## 2022-10-08 MED ORDER — VALACYCLOVIR 500 MG PO TAB
ORAL_TABLET | 0 refills | Status: AC
Start: 2022-10-08 — End: ?

## 2022-10-08 NOTE — Telephone Encounter
Requested Prescriptions   Pending Prescriptions Disp Refills    valACYclovir (VALTREX) 500 mg tablet [Pharmacy Med Name: valACYclovir HCl 500 MG Oral Tablet] 20 tablet 0     Sig: TAKE 1 TABLET BY MOUTH TWICE DAILY FOR  1  DAY  AS  NEEDED  FOR  COLD  SORE  FLARE       Antimicrobials: Antiviral Agents - Anti-Herpetic - Valacyclovir Passed - 10/08/2022  6:26 AM        Passed - Valid encounter within last 12 months     Recent Visits  Date Type Provider Dept   10/05/22 Office Visit Polsak, [place], DO [facility]   10/01/21 Office Visit Polsak, Micholee B, DO [facility]   Showing recent visits within past 720 days and meeting all other requirements  Future Appointments  No visits were found meeting these conditions.  Showing future appointments within next 360 days and meeting all other requirements            Passed - Cr on file     Creatinine   Date Value Ref Range Status   09/30/2022 0.72 0.50 - 1.05 mg/dL Final   09/17/2021 0.68 0.50 - 1.05 mg/dL Final      eGFR Non African American   Date Value Ref Range Status   09/30/2022 95 > OR = 60 mL/min/1.92m2 Final   09/17/2021 100 > OR = 60 mL/min/1.29m2 Final     Comment:     The eGFR is based on the CKD-EPI 2021 equation. To calculate   the new eGFR from a previous Creatinine or Cystatin C  result, go to https://www.kidney.org/professionals/  kdoqi/gfr%5Fcalculator       eGFR African American   Date Value Ref Range Status   05/29/2020 92 > OR = 60 mL/min/1.49m2 Final   04/12/2019 107 > OR = 60 mL/min/1.68m2 Final

## 2022-11-03 ENCOUNTER — Encounter: Admit: 2022-11-03 | Discharge: 2022-11-03 | Payer: BC Managed Care – PPO

## 2022-11-03 DIAGNOSIS — R922 Inconclusive mammogram: Secondary | ICD-10-CM

## 2022-11-03 DIAGNOSIS — Z1231 Encounter for screening mammogram for malignant neoplasm of breast: Secondary | ICD-10-CM

## 2022-11-04 ENCOUNTER — Ambulatory Visit: Admit: 2022-11-04 | Discharge: 2022-11-04 | Payer: BC Managed Care – PPO

## 2022-11-04 ENCOUNTER — Encounter: Admit: 2022-11-04 | Discharge: 2022-11-04 | Payer: BC Managed Care – PPO

## 2022-11-04 DIAGNOSIS — T7840XA Allergy, unspecified, initial encounter: Secondary | ICD-10-CM

## 2022-11-04 DIAGNOSIS — Z1231 Encounter for screening mammogram for malignant neoplasm of breast: Secondary | ICD-10-CM

## 2022-11-04 DIAGNOSIS — D219 Benign neoplasm of connective and other soft tissue, unspecified: Secondary | ICD-10-CM

## 2022-11-04 DIAGNOSIS — R922 Inconclusive mammogram: Secondary | ICD-10-CM

## 2022-11-04 DIAGNOSIS — J984 Other disorders of lung: Secondary | ICD-10-CM

## 2022-11-11 ENCOUNTER — Encounter: Admit: 2022-11-11 | Discharge: 2022-11-11 | Payer: BC Managed Care – PPO

## 2022-11-11 NOTE — Telephone Encounter
Patient called stating that she has lumps under her underarm on her left side. She reports that she did note them being on her [place] and that it said it was not concerning. But she wanted to be sure that this was read appropriately due to a poor experience during her [place]. Advised pt that these have been there since 2021 and we can discuss it further if a diagnostic mammo or [place] of that area was needed but these are stable. She VU and will call back with further concerns.

## 2022-11-11 NOTE — Telephone Encounter
Patient LVM asking for a call back about mammogram results.     RN LVM requesting call back or mychart message.

## 2022-11-23 ENCOUNTER — Ambulatory Visit: Admit: 2022-11-23 | Discharge: 2022-11-23 | Payer: BC Managed Care – PPO

## 2022-11-23 DIAGNOSIS — Z1211 Encounter for screening for malignant neoplasm of colon: Secondary | ICD-10-CM

## 2022-12-14 ENCOUNTER — Encounter: Admit: 2022-12-14 | Discharge: 2022-12-14 | Payer: BC Managed Care – PPO

## 2022-12-14 ENCOUNTER — Ambulatory Visit: Admit: 2022-12-14 | Discharge: 2022-12-14 | Payer: BC Managed Care – PPO

## 2022-12-14 DIAGNOSIS — J984 Other disorders of lung: Secondary | ICD-10-CM

## 2022-12-14 DIAGNOSIS — D219 Benign neoplasm of connective and other soft tissue, unspecified: Secondary | ICD-10-CM

## 2022-12-14 DIAGNOSIS — T7840XA Allergy, unspecified, initial encounter: Secondary | ICD-10-CM

## 2022-12-28 ENCOUNTER — Encounter: Admit: 2022-12-28 | Discharge: 2022-12-28 | Payer: BC Managed Care – PPO

## 2023-01-04 ENCOUNTER — Encounter: Admit: 2023-01-04 | Discharge: 2023-01-04 | Payer: BC Managed Care – PPO

## 2023-01-04 DIAGNOSIS — F419 Anxiety disorder, unspecified: Secondary | ICD-10-CM

## 2023-01-04 MED ORDER — ALPRAZOLAM 0.25 MG PO TAB
0.25 mg | ORAL_TABLET | Freq: Every day | ORAL | 1 refills | Status: AC | PRN
Start: 2023-01-04 — End: ?

## 2023-01-04 NOTE — Telephone Encounter
Some refill protocol elements NOT Met  Medication name: Xanax  Medication Strength: 0.25mg tablet      Off protocol   This refill cannot be delegated     Routed to Provider    Sederick Jacobsen, RN

## 2023-01-13 ENCOUNTER — Ambulatory Visit: Admit: 2023-01-13 | Discharge: 2023-01-13 | Payer: BC Managed Care – PPO

## 2023-01-13 DIAGNOSIS — Z1211 Encounter for screening for malignant neoplasm of colon: Secondary | ICD-10-CM

## 2023-02-15 ENCOUNTER — Encounter: Admit: 2023-02-15 | Discharge: 2023-02-15 | Payer: BC Managed Care – PPO

## 2023-02-15 DIAGNOSIS — T7840XA Allergy, unspecified, initial encounter: Secondary | ICD-10-CM

## 2023-02-15 DIAGNOSIS — D219 Benign neoplasm of connective and other soft tissue, unspecified: Secondary | ICD-10-CM

## 2023-02-15 DIAGNOSIS — J984 Other disorders of lung: Secondary | ICD-10-CM

## 2023-03-01 ENCOUNTER — Ambulatory Visit: Admit: 2023-03-01 | Discharge: 2023-03-01 | Payer: BC Managed Care – PPO

## 2023-03-01 ENCOUNTER — Encounter: Admit: 2023-03-01 | Discharge: 2023-03-01 | Payer: BC Managed Care – PPO

## 2023-03-01 DIAGNOSIS — T7840XA Allergy, unspecified, initial encounter: Secondary | ICD-10-CM

## 2023-03-01 DIAGNOSIS — D219 Benign neoplasm of connective and other soft tissue, unspecified: Secondary | ICD-10-CM

## 2023-03-01 DIAGNOSIS — J984 Other disorders of lung: Secondary | ICD-10-CM

## 2023-03-01 MED ORDER — LIDOCAINE (PF) 100 MG/5 ML (2 %) IV SYRG
INTRAVENOUS | 0 refills | Status: DC
Start: 2023-03-01 — End: 2023-03-01

## 2023-03-01 MED ORDER — PROPOFOL 10 MG/ML IV EMUL 50 ML (INFUSION)(AM)(OR)
INTRAVENOUS | 0 refills | Status: DC
Start: 2023-03-01 — End: 2023-03-01

## 2023-03-01 NOTE — Anesthesia Pre-Procedure Evaluation
Anesthesia Pre-Procedure Evaluation    Name: Christina Eaton      MRN: [id number]     DOB: 09/19/1960     Age: 62 y.o.     Sex: female   _________________________________________________________________________     Procedure Info:   Procedure Information       Date/Time: 03/01/23 1438    Procedure: COLONOSCOPY DIAGNOSTIC WITH SPECIMEN COLLECTION BY BRUSHING/ WASHING - FLEXIBLE    [facility]    Surgeons: Tempie Hoist, DO            Physical Assessment  Vital Signs (last filed in past 24 hours):  BP: 126/91 (06/24 1408)  Temp: 36.6 ?C (97.9 ?F) (06/24 1408)  Pulse: 72 (06/24 1408)  Respirations: 16 PER MINUTE (06/24 1408)  SpO2: 99 % (06/24 1408)  O2 Device: None (Room air) (06/24 1408)  Height: 170.2 cm (5' 7) (06/24 1408)  Weight: 61.7 kg (136 lb) (06/24 1408)      Patient History   No Known Allergies     Current Medications    Medication Directions   ALPRAZolam (XANAX) 0.25 mg tablet TAKE 1 TABLET BY MOUTH ONCE DAILY AS NEEDED FOR ANXIETY   ergocalciferol (vitamin D2) (VITAMIN D PO) Take  by mouth daily.   L-LYSINE PO Take 1,000 mg by mouth daily.   MULTIVITAMIN (MULTIPLE VITAMINS PO) Take  by mouth.   peg 3350-electrolytes (GOLYTELY) 236-22.74-6.74 -5.86 gram oral solution Mix as directed on package. Drink (8oz) every 10 minutes until gone. Refrigerate once mixed.   tretinoin (RETIN-A) 0.1 % topical cream Apply one g topically to affected area at bedtime daily.   valACYclovir (VALTREX) 500 mg tablet TAKE 1 TABLET BY MOUTH TWICE DAILY FOR 1  DAY AS  NEEDED FOR COLD SORE  FLARE       Review of Systems/Medical History      Patient summary reviewed  Nursing notes reviewed  Pertinent labs reviewed    PONV Screening: Non-smoker and Female sex    No history of anesthetic complications    No family history of anesthetic complications      Airway - negative        Pulmonary       Not a current smoker        No indications/hx of asthma      COPD       Cardiovascular - negative Exercise tolerance: >4 METS      Beta Blocker therapy: No      Beta blockers within 24 hours: n/a      No hypertension    No valvular problems/murmurs          No past MI      No hx of coronary artery disease        No PTCA            No dysrhythmias    No angina        No dyspnea on exertion      GI/Hepatic/Renal             No GERD        No liver disease:         No renal disease:         Bowel prep        Neuro/Psych - negative      No seizures        No CVA      Musculoskeletal -  negative        No neck pain        Endocrine/Other       No diabetes        No hypothyroidism      No anemia        Malignancy (h/o lung cancer):    treated      Constitution - negative       Physical Exam    Airway Findings      Mallampati: II      TM distance: >3 FB      Neck ROM: full      Mouth opening: good      Airway patency: adequate    Dental Findings: Negative      Cardiovascular Findings:       Rhythm: regular      Rate: normal      No murmur    Pulmonary Findings:       Breath sounds clear to auscultation.    Neurological Findings:       Alert and oriented x 3    Constitutional findings:       No acute distress      Well-developed      Well-nourished       Previous Airway Procedure Notes Displaying the 3 most recent records   No records found.         Patient Lines/Drains/Airways Status       Active Lines:       Name Placement date Placement time Site Days    Peripheral IV 10/20/17 1000 Right Antecubital 22 G 10/20/17  1000  Antecubital  1958                  Diagnostic Tests  Hematology:   Lab Results   Component Value Date    HGB 13.1 09/30/2022    HCT 38.3 09/30/2022    PLTCT 295 09/30/2022    WBC 5.7 09/30/2022    NEUT 55.5 09/30/2022    ANC 3164 09/30/2022    ALC 1892 09/30/2022    MONA 7.4 09/30/2022    AMC 422 09/30/2022    EOSA 2.8 09/30/2022    ABC 63 09/30/2022    MCV 97.5 09/30/2022    MCH 33.3 09/30/2022    MCHC 34.2 09/30/2022    MPV 10.5 09/30/2022    RDW 13.2 09/30/2022         General Chemistry:   Lab Results   Component Value Date    NA 132 09/30/2022    K 4.5 09/30/2022    CL 97 09/30/2022    CO2 25 09/30/2022    GAP 5 04/04/2016    BUN 10 09/30/2022    CR 0.72 09/30/2022    GLU 93 09/30/2022    CA 9.5 09/30/2022    ALBUMIN 4.5 09/30/2022    TOTBILI 0.6 09/30/2022      Coagulation:   Lab Results   Component Value Date    PTT 26.7 04/01/2016    INR 0.9 04/01/2016       PAC Plan    Anesthesia Plan    ASA score: 2   Plan: MAC  NPO status: acceptable      Informed Consent  Anesthetic plan and risks discussed with patient.        Plan discussed with: anesthesiologist, CRNA and surgeon/proceduralist.  Comments: (I discussed the risks/benefits of proceeding with Monitored Anesthesia Care including: 1)possible awareness during the procedure 2) change of anesthetic plan  and conversion to a general anesthetic 3)the need to be awakened during key portions of the procedure by the surgeon.  The patient expressed understanding and will proceed with Monitored Anesthesia Care.  )      Alerts

## 2023-03-01 NOTE — Anesthesia Post-Procedure Evaluation
Post-Anesthesia Evaluation    Name: Christina Eaton      MRN: [id number]     DOB: [birth year]     Age: 62 y.o.     Sex: female   __________________________________________________________________________     Procedure Information       Anesthesia Start Date/Time: 03/01/23 1453    Procedures:       COLONOSCOPY DIAGNOSTIC WITH SPECIMEN COLLECTION BY BRUSHING/ WASHING - FLEXIBLE      COLONOSCOPY WITH SNARE REMOVAL TUMOR/ POLYP/ OTHER LESION    [facility]    Surgeons: Tempie Hoist, DO            Post-Anesthesia Vitals  BP: 144/91 (06/24 1540)  Temp: 36.6 ?C (97.9 ?F) (06/24 1408)  Pulse: 62 (06/24 1540)  Respirations: 20 PER MINUTE (06/24 1540)  SpO2: 100 % (06/24 1540)  O2 Device: None (Room air) (06/24 1540)  Height: 170.2 cm (5' 7) (06/24 1408)   Vitals Value Taken Time   BP 144/91 03/01/23 1540   Temp     Pulse 62 03/01/23 1540   Respirations 20 PER MINUTE 03/01/23 1540   SpO2 100 % 03/01/23 1540   O2 Device None (Room air) 03/01/23 1540   ABP     ART BP           Post Anesthesia Evaluation Note    Evaluation location: pre/post  Patient participation: recovered; patient participated in evaluation  Level of consciousness: alert    Pain score: 0  Pain management: adequate    Hydration: normovolemia  Temperature: 36.0?C - 38.4?C  Airway patency: adequate    Perioperative Events       Post-op nausea and vomiting: no PONV    Postoperative Status  Cardiovascular status: hemodynamically stable  Respiratory status: spontaneous ventilation        Perioperative Events  There were no known complications for this encounter.

## 2023-03-02 ENCOUNTER — Encounter: Admit: 2023-03-02 | Discharge: 2023-03-02 | Payer: BC Managed Care – PPO

## 2023-03-02 DIAGNOSIS — J984 Other disorders of lung: Secondary | ICD-10-CM

## 2023-03-02 DIAGNOSIS — D219 Benign neoplasm of connective and other soft tissue, unspecified: Secondary | ICD-10-CM

## 2023-03-02 DIAGNOSIS — T7840XA Allergy, unspecified, initial encounter: Secondary | ICD-10-CM

## 2023-04-10 ENCOUNTER — Encounter: Admit: 2023-04-10 | Discharge: 2023-04-10 | Payer: BC Managed Care – PPO

## 2023-04-12 ENCOUNTER — Encounter: Admit: 2023-04-12 | Discharge: 2023-04-12 | Payer: BC Managed Care – PPO

## 2023-04-12 DIAGNOSIS — B001 Herpesviral vesicular dermatitis: Secondary | ICD-10-CM

## 2023-04-12 MED ORDER — VALACYCLOVIR 500 MG PO TAB
ORAL_TABLET | 0 refills | Status: AC
Start: 2023-04-12 — End: ?

## 2023-04-12 NOTE — Telephone Encounter
Requested Prescriptions   Pending Prescriptions Disp Refills    valACYclovir (VALTREX) 500 mg tablet [Pharmacy Med Name: valACYclovir HCl 500 MG Oral Tablet] 20 tablet 0     Sig: TAKE 1 TABLET BY MOUTH TWICE DAILY FOR 1  DAY AS  NEEDED FOR COLD SORE  FLARE       Antimicrobials: Antiviral Agents - Anti-Herpetic - Valacyclovir Passed - 04/12/2023  8:20 AM        Passed - Valid encounter within last 12 months     Recent Visits  Date Type Provider Dept   10/05/22 Office Visit Hurman Horn B, DO [facility]   10/01/21 Office Visit Polsak, Micholee B, DO [facility]   Showing recent visits within past 720 days and meeting all other requirements  Future Appointments  Date Type Provider Dept   10/06/23 Appointment Polsak, Micholee B, DO [facility]   Showing future appointments within next 360 days and meeting all other requirements            Passed - Cr on file     Creatinine   Date Value Ref Range Status   09/30/2022 0.72 0.50 - 1.05 mg/dL Final   16/06/9603 5.40 0.50 - 1.05 mg/dL Final      eGFR Non African American   Date Value Ref Range Status   09/30/2022 95 > OR = 60 mL/min/1.62m2 Final   09/17/2021 100 > OR = 60 mL/min/1.65m2 Final     Comment:     The eGFR is based on the CKD-EPI 2021 equation. To calculate   the new eGFR from a previous Creatinine or Cystatin C  result, go to https://www.kidney.org/professionals/  kdoqi/gfr%5Fcalculator       eGFR African American   Date Value Ref Range Status   05/29/2020 92 > OR = 60 mL/min/1.32m2 Final   04/12/2019 107 > OR = 60 mL/min/1.75m2 Final

## 2023-05-17 ENCOUNTER — Ambulatory Visit: Admit: 2023-05-17 | Discharge: 2023-05-17 | Payer: BC Managed Care – PPO

## 2023-05-17 ENCOUNTER — Encounter: Admit: 2023-05-17 | Discharge: 2023-05-17 | Payer: BC Managed Care – PPO

## 2023-05-17 DIAGNOSIS — L738 Other specified follicular disorders: Secondary | ICD-10-CM

## 2023-05-17 DIAGNOSIS — L578 Other skin changes due to chronic exposure to nonionizing radiation: Secondary | ICD-10-CM

## 2023-05-17 DIAGNOSIS — L814 Other melanin hyperpigmentation: Secondary | ICD-10-CM

## 2023-05-17 DIAGNOSIS — D225 Melanocytic nevi of trunk: Secondary | ICD-10-CM

## 2023-05-17 DIAGNOSIS — D219 Benign neoplasm of connective and other soft tissue, unspecified: Secondary | ICD-10-CM

## 2023-05-17 DIAGNOSIS — J984 Other disorders of lung: Secondary | ICD-10-CM

## 2023-05-17 DIAGNOSIS — D2310 Other benign neoplasm of skin of unspecified eyelid, including canthus: Secondary | ICD-10-CM

## 2023-05-17 DIAGNOSIS — T7840XA Allergy, unspecified, initial encounter: Secondary | ICD-10-CM

## 2023-05-17 NOTE — Patient Instructions
For laser treatment, contact Dr. Mark McCune at [phone]    Office location:  [address]  [address]  [place], [place] [address]    Website:   [email or web address]    Vitamin D  - We recommend Vitamin D supplementation after a fatty meal, especially if there is no contraindications such as kidney stones    Moles  --Common melanocytic nevi (moles) tend to be =6 mm in diameter and symmetric with even pigmentation, round or oval shape, regular outline, and sharp, non-fuzzy border.  --Dermal nevi stick out from the skin, but if they are soft they are usually not worrisome.  --Flaky seemingly stuck-on brown bumps are usually benign keratoses, not moles.  --Bright red smooth bumps that do not bleed are usually benign blood vessel lesions (cherry angiomas), not moles.  --Atypical nevi/clinical features of possible melanoma include asymmetry, border irregularities, color variability, and diameter >6 mm.  The earliest sign of melanoma is usually a rapidly growing mole.  --About half of melanoma arises in an existing mole and up to half on normal skin.  --It is normal to get new moles until the age of 30-40 years old.  --Multiple atypical nevi are a marker of increased risk of melanoma. The risk of melanoma depends also upon the total number of nevi, family and/or personal history of melanoma, and sun exposure history.   --It is important to look at your moles once a month.  It may help to follow them with photos such as on your smart phone.  Looking once a month you can notice rapid changes and call if these occur.  --Using sunscreens and sun avoidance will decrease your risk of developing melanoma.  The best sun protection is sun avoidance, including with clothing such as long sleeves and a broad brimmed hat.  The best sunscreens are SPF 30 or above cream based with zinc oxide.  One ounce (shot glass sized) amount is needed for an adult.  It should be reapplied every 2 hours if possible.  --Any tanning bed use will increase your risk of melanoma significantly.    Sun Protection   UPF/SPF rated clothing (gloves, long sleeves, scarves); broad-brimmed hats (NOT ball caps!)   RIT Sunguard laundry additive can increase the SPF value of your everyday clothing   Cowboy hats protect from sun; baseball hats don't   Here are several recommended zinc-based sunscreen brands in aplphabetical order (always read the ingredient list, as many brands have multiple varieties of sunscreens and not all are zinc-based)   Badger, Bare Minerals, Blue Lizard, CeraVe, Cotz, Goddess Garden, Green Screen,  [employer], Mineral Fusion, SkinCeuticals, Vanicream   2 shot-glases = whole body    Melanoma Patient Information    Also called malignant melanoma     Skin cancer screening: If you notice a mole that differs from others or one that changes, bleeds, or itches, see a dermatologist.   Melanoma is a type of skin cancer. Anyone can get melanoma. When found early and treated, the cure rate is nearly 100%. Allowed to grow, melanoma can spread to other parts of the body. Melanoma can spread quickly. When melanoma spreads, it can be deadly.Dermatologists believe that the number of deaths from melanoma would be much lower if people:  Knew the warning signs of melanoma.   Learned how to examine their skin for signs of skin cancer.   Took the time to examine their skin.   It's important to take time to look at   the moles on your skin because this is a good way to find melanoma early. When checking your skin, you should look for the ABCDEs of melanoma.     ABCDE's of melanoma:  When performing monthly skin exams for your moles or new moles, remember the ABCDE's of melanoma:    A - Asymmetry. (Concerning if spot is not symmetric)  B - Border. (Irregular border or notched border are concerning)  C - Color. (Multiple colors or changes in color are concerning.)  D - Diameter. (Larger than 6mm, ie, a pencil eraser, is concerning.)  E - Evolution. (An evolving or changing spot is concerning. If new itch, tenderness, or bleeding develop, these are concerning changes too.  See further explanation below:    Melanoma: Signs and symptoms     Anyone can get melanoma. It's important to take time to look at the moles on your skin because this is a good way to find melanoma early. When checking your skin, you should look for the ABCDEs of melanoma.     ABCDEs of melanoma     A = Asymmetry  One half is unlike the other half.       B = Border  An irregular, scalloped, or poorly defined border.       C = Color  Is varied from one area to another; has shades of tan, brown or black, or is sometimes white, red, or blue.       D = Diameter  Melanomas usually greater than 6mm (the size of a pencil eraser) when diagnosed, but they can be smaller.       E = Evolving  A mole or skin lesion that looks different from the rest or is changing in size, shape, or color.    !! If you see a mole or new spot on your skin that has any of the ABCDEs, immediately make an appointment to see a dermatologist.    Signs of melanoma  The most common early signs (what you see) of melanoma are:     Growing mole on your skin.   Unusual looking mole on your skin or a mole that does not look like any other mole on your skin (the ugly duckling).   Non-uniform mole (has an odd shape, uneven or uncertain border, different colors).     Symptoms of melanoma  In the early stages, melanoma may not cause any symptoms (what you feel). But sometimes melanoma will:    Itch.    Bleed.    Feel painful.   Many melanomas have these signs and symptoms, but not all. There are different types of melanoma. One type can first appear as a brown or black streak underneath a fingernail or toenail. Melanoma also can look like a bruise that just won't heal.     Who gets melanoma?  Anyone can get melanoma. Most people who get it have light skin, but people who have brown and black skin also get melanoma.   Some people have a higher risk of getting melanoma. These people have the following traits:   Skin    Fair skin (The risk is higher if the person also has red or blond hair and blue or green eyes).    Sun-sensitive skin (rarely tans or burns easily).    50-plus moles, large moles, or unusual-looking moles.    If you have had bad sunburns or spent time tanning (sun, tanning beds, or sun lamps), you also have a higher   risk of getting melanoma.   Men older than 50 are at a higher risk for developing skin cancers, including melanoma. Learning how to check your skin and getting skin exams can help detect skin cancer.    Family/medical history   Melanoma runs in the family (parent, child, sibling, cousin, aunt, uncle had melanoma).   You had another skin cancer, but most especially another melanoma.   A weakened immune system.      Research shows that indoor tanning increases a person's melanoma risk by 75%. The risk also may increase if you had breast or thyroid cancer.    More people getting melanoma  Fewer people are getting most types of cancer. Melanoma is different. More people are getting melanoma. Many are white men who are 50 years or older. More young people also are getting melanoma. Melanoma is now the most common cancer among people 25-29 years old. Even teenagers are getting melanoma.    What causes melanoma?  Ultraviolet (UV) radiation is a major contributor in most cases. We get UV radiation from the sun, tanning beds, and sun lamps. Heredity also plays a role. Research shows that if a close blood relative (parent, child, sibling, aunt, uncle) had melanoma, a person has a much greater risk of getting melanoma.     How do dermatologists diagnose melanoma?  To diagnose melanoma, a dermatologist begins by looking at the patient's skin. A dermatologist will carefully examine moles and other suspicious spots. To get a better look, a dermatologist may use a device called a dermoscope.      Vitamin D    Our bodies need vitamin D to build strong and healthy bones. Vitamin D helps the body absorb the calcium that our bones require.     For a healthy person, the recommended daily dietary allowance is 600 international units for people of 1-70 years old, and 800 international units for people older than 71 years. More vitamin D is not better. Higher amounts of vitamin D could be harmful, leading to many health problems such as high blood pressure and kidney damage.     American academy of Dermatology recommending everyone get vitamin D from foods naturally rich in vitamin D, foods and beverages fortified with vitamin D or vitamin D supplements. The foods that contain the greatest amount are fatty fishes such as salmon, tuna and mackerel. Fish liver oil is another good source.     One of the sources to look up vitamin amount is through National Agriculture library. Http://ndb.nal.usda.gov/. This can help you find out whether you get enough vitamin D from your diet. If you are like many people, you may not be getting your recommended dietary allowance of vitamin D. You may want to change the foods that you eat or take a vitamin D supplements. Before you start taking a vitamin D supplement, talk with your doctor.     Vitamin D is produced in the skin by UV light, but the amount is highly variable and depends on many factors. However, getting vitamin D from the sun or tanning beds can 1) increase your risk of developing skin cancer including melanoma which can be deadly, 2) resulting premature skin aging (wrinkles, age spots, blotchy complexion) and 3) leading to a weakened immune system. Therefore, American academy of Dermatology recommend getting vitamin D safely from foods, beverages and supplements.

## 2023-05-17 NOTE — Progress Notes
Date of Service: 05/17/2023    Subjective:             Christina Eaton is a 62 y.o. female.    History of Present Illness  New patient, referred by [facility] for FBSE.    # Spots under both eyes  - Present for several years  - Slowly getting more over time   - Concerned that they are milia, however has tried to pop them before without any drainage  - Currently using tretinoin 0.1% cream nightly  - No history of T2DM or thyroid disease    # History of brown and tan spots distributed over the head, trunk, arms and legs  - These have been present for many years  - There is no history of blistering sunburns, prior history of tanning bed usage  - None are itching, bleeding, or painful    # History of brown bumps on the torso and extremities  - Lesions have become more numerous with age    PMH: No history of skin cancer, 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 Demetrios Isaacs         Objective:         ALPRAZolam (XANAX) 0.25 mg tablet TAKE 1 TABLET BY MOUTH ONCE DAILY AS NEEDED FOR ANXIETY    ergocalciferol (vitamin D2) (VITAMIN D PO) Take  by mouth daily.    L-LYSINE PO Take 1,000 mg by mouth daily.    MULTIVITAMIN (MULTIPLE VITAMINS PO) Take  by mouth.    peg 3350-electrolytes (GOLYTELY) 236-22.74-6.74 -5.86 gram oral solution Mix as directed on package. Drink (8oz) every 10 minutes until gone. Refrigerate once mixed.    tretinoin (RETIN-A) 0.1 % topical cream Apply one g topically to affected area at bedtime daily.    valACYclovir (VALTREX) 500 mg tablet TAKE 1 TABLET BY MOUTH TWICE DAILY FOR 1  DAY AS  NEEDED FOR COLD SORE  FLARE     Vitals:    05/17/23 0850   PainSc: Zero   Weight: 61.7 kg (136 lb)   Height: 171.5 cm (5' 7.5)     Body mass index is 20.99 kg/m?Marland Kitchen     Physical Exam  General: Alert and oriented, no acute distress  Eyes: Conjunctiva clear, normal EOM    Areas Examined (all normal unless noted below):  Head/Face  Neck  Chest/axillae  Back  Abdomen  Buttocks  R upper ext  L upper ext  R lower ext  L lower ext  Breast and groin exam deferred     Pertinent findings include:    Several brown and tan evenly pigmented macules are distributed over the examined areas. All have symmetric similar dermascopic findings with primarily globular and reticular patterns.    Soft, pigmented, stuck-on-appearing papules are distributed over the examined areas. All have symmetric pebbled dermoscopic findings.    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 Alphonzo Lemmings in [facility] for laser treatments options     # Seborrheic keratoses  - Discussed  benign nature  - Reassurance    # Lentigines with sun damaged skin  - discussed options of topical tretinoin vs laser treatment  - Continue (Rx) tretinoin 1% cream apply pea sized amount to entire face 1-2x weekly at night increase to nightly as tolerated    # Melanocytic nevi  - Will cont to monitor  - RTC for new/changing lesions  - Counseled on ABCD's of melanoma and given pamphlet on melanoma  - Counseled on sunscreen SPF 30 or greater and wide-brimmed hat use  - Recommend vitamin D3 1000-2000 U/day in winter      RTC in 1 year, sooner if needed

## 2023-05-17 NOTE — Progress Notes
ATTESTATION    I personally performed the key portions of the E/M visit, discussed case with resident and concur with resident documentation of history, physical exam, assessment, and treatment plan unless otherwise noted.    Staff name:  Shela Nevin Date:  05/17/2023

## 2023-05-18 ENCOUNTER — Encounter: Admit: 2023-05-18 | Discharge: 2023-05-18 | Payer: BC Managed Care – PPO

## 2023-05-20 ENCOUNTER — Encounter: Admit: 2023-05-20 | Discharge: 2023-05-20 | Payer: BC Managed Care – PPO

## 2023-05-20 NOTE — Progress Notes
PA submitted on [email or web address]-collins.[email or web address] for tretinoin 0.1% cream for solar lentigo. EOC ID [id number].

## 2023-07-08 ENCOUNTER — Encounter: Admit: 2023-07-08 | Discharge: 2023-07-08 | Payer: BC Managed Care – PPO

## 2023-07-19 ENCOUNTER — Encounter: Admit: 2023-07-19 | Discharge: 2023-07-19 | Payer: BC Managed Care – PPO

## 2023-07-20 ENCOUNTER — Ambulatory Visit: Admit: 2023-07-20 | Discharge: 2023-07-21 | Payer: BC Managed Care – PPO

## 2023-07-20 ENCOUNTER — Encounter: Admit: 2023-07-20 | Discharge: 2023-07-20 | Payer: BC Managed Care – PPO

## 2023-07-20 NOTE — Progress Notes
 Administered 0.71mL influenza quadrivalent vaccine into right deltoid.  Patient signed consent form and was given VIS sheet. Patient tolerated injection well and had no complaints.    Pt presented to clinic for dose of spikevax.  Injected 0.5 ml into left deltoid.  Pt remained for 15 minute observation.  Pt tolerated well.

## 2023-08-18 ENCOUNTER — Encounter: Admit: 2023-08-18 | Discharge: 2023-08-18 | Payer: BC Managed Care – PPO

## 2023-08-19 ENCOUNTER — Encounter: Admit: 2023-08-19 | Discharge: 2023-08-19 | Payer: BC Managed Care – PPO

## 2023-08-20 ENCOUNTER — Encounter: Admit: 2023-08-20 | Discharge: 2023-08-20 | Payer: BC Managed Care – PPO

## 2023-08-20 DIAGNOSIS — Z0000 Encounter for general adult medical examination without abnormal findings: Secondary | ICD-10-CM

## 2023-08-20 DIAGNOSIS — E041 Nontoxic single thyroid nodule: Secondary | ICD-10-CM

## 2023-08-20 NOTE — Telephone Encounter
Sure - labs ordered. Thanks

## 2023-08-20 NOTE — Telephone Encounter
Pt left VM asking if she has lab orders for her upcoming visit 10/06/2023.

## 2023-08-23 ENCOUNTER — Encounter: Admit: 2023-08-23 | Discharge: 2023-08-23 | Payer: BC Managed Care – PPO

## 2023-08-23 ENCOUNTER — Ambulatory Visit: Admit: 2023-08-23 | Discharge: 2023-08-24 | Payer: BC Managed Care – PPO

## 2023-08-23 DIAGNOSIS — C3490 Malignant neoplasm of unspecified part of unspecified bronchus or lung: Secondary | ICD-10-CM

## 2023-08-23 DIAGNOSIS — Z08 Encounter for follow-up examination after completed treatment for malignant neoplasm: Secondary | ICD-10-CM

## 2023-08-25 ENCOUNTER — Encounter: Admit: 2023-08-25 | Discharge: 2023-08-25 | Payer: BC Managed Care – PPO

## 2023-08-26 NOTE — Progress Notes
Date of Service: 08/27/2023  MRN#: [id number]  DOB: 1961-03-28  PCP: Hurman Horn B    Subjective:   No chief complaint on file.    Christina Eaton is a 62 y.o.female patient who presents for a return visit.     Patient Reported Other  What topic(s) would you like to cover during your appointment?:  Lower back pain  Please describe the issue(s) and history with the issue (location, severity, duration, symptoms, etc.).:  Lower back pain.  What has been done so far to take care of the issue(s)?:  Ibuprofen   Tylenol  Patches  Biofreeze  Bengay  What are your goals for this visit?:  To get something stronger to expedite the healing process.  I  have a lot of physical work to,do,in the next couple of days.  Sunday we are departing for [facility]. Long , uncomfortable flight . So, i am hoping my back pain will be diminished.  It has been painful for about a week.        Christina Eaton presents to our telehealth visit today to discuss low back pain. Located on right side. Pain is localized. No radiation of pain, numbness, tingling. No known injury prior to onset of pain. Thinks her back pain may have been caused by wearing worn out shoes.     Is a very active person. Has avoiding exercising for the last several days hoping rest would make a difference. Unfortunately it has not helped. She reports pain with leaning forward. Laying down provides some relief. Has tried ibuprofen, tylenol, biofreeze, bengay, topical patches, hydrocodone x1 (did not help). Has also tried heating pad, light stretching with no improvement.     Leaving for [place] in two days.       Past Medical History:    Allergy    Fibroids    Pulmonary lesion, right     Surgical History:   Procedure Laterality Date    Right video assisted THORACOSCOPY, right lower lobectomy Right 04/03/2016    Performed by Bryson Dames, MD at [facility] CVOR    COLONOSCOPY DIAGNOSTIC WITH SPECIMEN COLLECTION BY BRUSHING/ WASHING - FLEXIBLE N/A 03/01/2023    Performed by Tempie Hoist, DO at [facility]2 OR    COLONOSCOPY WITH SNARE REMOVAL TUMOR/ POLYP/ OTHER LESION N/A 03/01/2023    Performed by Tempie Hoist, DO at [facility] OR    HX LOBECTOMY      HYSTERECTOMY      PARTIAL HYSTERECTOMY  I don't know    fibroids     Social History     Socioeconomic History    Marital status: Married   Tobacco Use    Smoking status: Former     Current packs/day: 0.00     Types: Cigarettes     Quit date: 10/09/2015     Years since quitting: 7.8    Smokeless tobacco: Never    Tobacco comments:     I don't know   Substance and Sexual Activity    Alcohol use: Yes     Comment: occasionally -variable amount depending on the occasion    Drug use: Never    Sexual activity: Yes     Partners: Male     Birth control/protection: Post-menopausal, None   Social History Narrative    She has been married for 20 years.  She has no children but her husband has some.  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  She loves being active outdoors.  She hikes and walks often.  She has dogs.  [place]     Social History     Tobacco Use    Smoking status: Former     Current packs/day: 0.00     Types: Cigarettes     Quit date: 10/09/2015     Years since quitting: 7.8    Smokeless tobacco: Never    Tobacco comments:     I don't know   Substance Use Topics    Alcohol use: Yes     Comment: occasionally -variable amount depending on the occasion     family history includes Cancer-Breast in her maternal aunt; Cancer-Breast (age of onset: 25) in her mother; Heart Failure in her father; Parkinson's  in her father.  Depression Screening:  PHQ-2: PHQ-2 Score: (Patient-Rptd) 0 (08/26/2023  5:54 PM)  PHQ-9: No data recorded  Interventions:  PHQ-2: PHQ-2 Score less than 3: No follow-up or recommendations are necessary at this time (08/23/2023 12:32 PM)  Depression Interventions PHQ-2/9: No data recorded    Review of Systems  Per HPI     Objective:     Medication:   ALPRAZolam (XANAX) 0.25 mg tablet TAKE 1 TABLET BY MOUTH ONCE DAILY AS NEEDED FOR ANXIETY    ergocalciferol (vitamin D2) (VITAMIN D PO) Take  by mouth daily.    L-LYSINE PO Take 1,000 mg by mouth daily.    MULTIVITAMIN (MULTIPLE VITAMINS PO) Take  by mouth.    tretinoin (RETIN-A) 0.1 % topical cream Apply one g topically to affected area at bedtime daily.    valACYclovir (VALTREX) 500 mg tablet TAKE 1 TABLET BY MOUTH TWICE DAILY FOR 1 DAY AS NEEDED FOR  COLD  SORE  FLARE       There were no vitals filed for this visit.    There is no height or weight on file to calculate BMI.    Physical Exam  Constitutional:       Appearance: Normal appearance.   Musculoskeletal:        Back:    Neurological:      Mental Status: She is alert and oriented to person, place, and time.   Psychiatric:         Mood and Affect: Mood normal.          Assessment and Plan:  1. Acute right-sided low back pain without sciatica (Primary)  - start flexeril 5 mg TID PRN as needed for pain. Counseled on possibility of lethargy. Do not take with xanax. Can continue taking tylenol, topical patches, heating pad, gentle stretching.   - if pain not improved with flexeril, could send in different muscle relaxer. Will be out of town for 21 days so would recommend PT upon returning if pain is still a problem as well as lumbar imaging, spine center referral.   - cyclobenzaprine (FLEXERIL) 5 mg tablet; Take one tablet by mouth three times daily.  Dispense: 90 tablet; Refill: 0           Total of 30 minutes were spent on the same day of the visit including preparing to see the patient, obtaining and/or reviewing separately obtained history, performing a medically appropriate examination and/or evaluation, counseling and educating the patient/family/caregiver, ordering medications, tests, or procedures, referring and communication with other health care professionals, documenting clinical information in the electronic or other health record, independently  interpreting results and communicating results to the patient/family/caregiver, and care coordination. I used a dictation program when conducting this note. Due to this, there may be grammatical errors or inconsistencies. For clarification or questions, please contact me.     Future Appointments   Date Time Provider Department Center   08/27/2023  8:00 AM Rayburn Felt, APRN-NP [facility] IM   10/06/2023 11:00 AM Polsak, Micholee B, DO [facility] IM     There are no Patient Instructions on file for this visit.  Rayburn Felt, APRN-NP

## 2023-08-27 ENCOUNTER — Ambulatory Visit: Admit: 2023-08-27 | Discharge: 2023-08-28 | Payer: BC Managed Care – PPO

## 2023-08-27 ENCOUNTER — Encounter: Admit: 2023-08-27 | Discharge: 2023-08-27 | Payer: BC Managed Care – PPO

## 2023-08-27 DIAGNOSIS — M545 Acute right-sided low back pain without sciatica: Secondary | ICD-10-CM

## 2023-08-27 MED ORDER — CYCLOBENZAPRINE 5 MG PO TAB
5 mg | ORAL_TABLET | Freq: Three times a day (TID) | ORAL | 0 refills | 30.00000 days | Status: AC
Start: 2023-08-27 — End: ?

## 2023-09-29 ENCOUNTER — Encounter: Admit: 2023-09-29 | Discharge: 2023-09-29 | Payer: BC Managed Care – PPO

## 2023-09-29 ENCOUNTER — Ambulatory Visit: Admit: 2023-09-29 | Discharge: 2023-09-30 | Payer: BC Managed Care – PPO

## 2023-09-30 ENCOUNTER — Encounter: Admit: 2023-09-30 | Discharge: 2023-09-30 | Payer: BC Managed Care – PPO

## 2023-09-30 DIAGNOSIS — D75839 Thrombocytosis: Secondary | ICD-10-CM

## 2023-10-06 ENCOUNTER — Encounter: Admit: 2023-10-06 | Discharge: 2023-10-06 | Payer: BC Managed Care – PPO

## 2023-10-06 ENCOUNTER — Ambulatory Visit: Admit: 2023-10-06 | Discharge: 2023-10-07 | Payer: BC Managed Care – PPO

## 2023-10-06 DIAGNOSIS — Z1231 Encounter for screening mammogram for malignant neoplasm of breast: Secondary | ICD-10-CM

## 2023-10-06 DIAGNOSIS — F419 Anxiety disorder, unspecified: Secondary | ICD-10-CM

## 2023-10-06 DIAGNOSIS — D509 Iron deficiency anemia, unspecified: Secondary | ICD-10-CM

## 2023-10-06 DIAGNOSIS — E042 Nontoxic multinodular goiter: Secondary | ICD-10-CM

## 2023-10-06 DIAGNOSIS — R92333 Heterogeneously dense tissue of both breasts on mammography: Secondary | ICD-10-CM

## 2023-10-06 DIAGNOSIS — Z0000 Encounter for general adult medical examination without abnormal findings: Secondary | ICD-10-CM

## 2023-10-06 DIAGNOSIS — J438 Other emphysema: Secondary | ICD-10-CM

## 2023-10-06 DIAGNOSIS — E041 Nontoxic single thyroid nodule: Secondary | ICD-10-CM

## 2023-10-06 DIAGNOSIS — R7301 Impaired fasting glucose: Secondary | ICD-10-CM

## 2023-10-06 DIAGNOSIS — C3490 Malignant neoplasm of unspecified part of unspecified bronchus or lung: Secondary | ICD-10-CM

## 2023-10-06 DIAGNOSIS — Z23 Encounter for immunization: Secondary | ICD-10-CM

## 2023-10-06 MED ORDER — TRETINOIN 0.1 % TP CREA
1 g | Freq: Every evening | TOPICAL | 3 refills | Status: DC
Start: 2023-10-06 — End: 2023-10-06

## 2023-10-06 MED ORDER — TRETINOIN 0.1 % TP CREA
1 g | Freq: Every evening | TOPICAL | 3 refills | Status: AC
Start: 2023-10-06 — End: ?

## 2023-10-06 MED ORDER — ALPRAZOLAM 0.25 MG PO TAB
0.25 mg | ORAL_TABLET | ORAL | 0 refills | Status: AC | PRN
Start: 2023-10-06 — End: ?

## 2023-10-06 NOTE — Progress Notes
Subjective:       History of Present Illness  Christina Eaton is a 63 y.o. female is here for routine physical.    She returned from [place] January 11.  It seems each time she returns from a trip it takes her down and she gets sick.  She was feeling more sick and even tested negative for COVID on the 14th.  She had to miss work for day.  She was coughing up green phlegm but now is just white and somewhat in the back of her throat.  She has pressure in her left ear and a dull headache.  She denies any sinus pain or pain with chewing.  She has to keep clearing her throat.  She feels fine in general meaning she has no fever, chills or sweats and she is not achy or tired.  She only gets sick about twice a year.  She tries to get good sleep and she exercise on a regular basis.  She has been drinking plenty of fluids.  She started taking some greens.      She will be due for mammogram and automated breast ultrasound February 28 or later.    She did not have the thyroid ultrasound last year and we discussed follow-up on that from 2023.  They mentioned a potential biopsy but she is not interested in a biopsy right now.  She denies any enlargement in her neck or difficulty swallowing.    Labs January 22 showed slightly high platelets 430, low iron, normal chemistry except for fasting glucose of 106.  Point-of-care A1c today in clinic is 5.5.  Vitamin D excellent 72, cholesterol excellent thyroid normal.   Colonoscopy was June 2024 and showed several polyps pathology with fragments of tubular adenoma so they recommend follow-up in 3 years.    She is considering establishing with dermatology outside.       Past Medical History:    Allergy    Fibroids    Pulmonary lesion, right         Review of Systems   Constitutional:  Negative for appetite change, chills, diaphoresis, fatigue, fever and unexpected weight change.   HENT:  Positive for ear pain and postnasal drip. Negative for congestion, hearing loss, mouth sores, rhinorrhea, sinus pressure, sinus pain, sneezing, sore throat and voice change.    Eyes:  Negative for visual disturbance.   Respiratory:  Positive for cough. Negative for shortness of breath and wheezing.    Cardiovascular:  Negative for chest pain, palpitations and leg swelling.   Gastrointestinal:  Negative for abdominal distention, abdominal pain, blood in stool, constipation, diarrhea, nausea and vomiting.   Genitourinary:  Negative for dysuria, enuresis, frequency, hematuria, menstrual problem and urgency.   Musculoskeletal:  Negative for arthralgias, back pain, gait problem, joint swelling and myalgias.   Skin:  Negative for rash and wound.   Allergic/Immunologic: Negative for immunocompromised state.   Neurological:  Negative for dizziness, weakness, light-headedness, numbness and headaches.   Hematological:  Negative for adenopathy. Does not bruise/bleed easily.   Psychiatric/Behavioral:  Negative for dysphoric mood and sleep disturbance. The patient is not nervous/anxious.          Objective:          ALPRAZolam (XANAX) 0.25 mg tablet Take one tablet by mouth every 24 hours as needed for Anxiety.    ergocalciferol (vitamin D2) (VITAMIN D PO) Take  by mouth daily.    L-LYSINE PO Take 1,000 mg by mouth daily.  MULTIVITAMIN (MULTIPLE VITAMINS PO) Take  by mouth.    tretinoin (RETIN-A) 0.1 % topical cream Apply one g topically to affected area at bedtime daily.    valACYclovir (VALTREX) 500 mg tablet TAKE 1 TABLET BY MOUTH TWICE DAILY FOR 1 DAY AS NEEDED FOR  COLD  SORE  FLARE     Vitals:    10/06/23 1052 10/06/23 1105   BP: (!) 142/89 (!) (P) 144/92   Pulse: 65 (P) 63   Temp: 37 ?C (98.6 ?F)    TempSrc: Oral    PainSc: Zero    Weight: 62.1 kg (137 lb)    Height: 170.2 cm (5' 7)      Body mass index is 21.46 kg/m?Marland Kitchen     Physical Exam  Vitals and nursing note reviewed.   Constitutional:       Appearance: Normal appearance. She is well-developed.   HENT:      Head: Normocephalic and atraumatic.      Right Ear: Tympanic membrane, ear canal and external ear normal.      Left Ear: Ear canal and external ear normal. There is no impacted cerumen. Tympanic membrane is bulging. Tympanic membrane is not injected, perforated or erythematous.      Nose: Nose normal.      Mouth/Throat:      Mouth: Mucous membranes are moist.      Pharynx: No oropharyngeal exudate.   Eyes:      General: No scleral icterus.     Conjunctiva/sclera: Conjunctivae normal.      Pupils: Pupils are equal, round, and reactive to light.   Neck:      Thyroid: No thyromegaly.   Cardiovascular:      Rate and Rhythm: Normal rate and regular rhythm.      Heart sounds: Normal heart sounds. No murmur heard.  Pulmonary:      Effort: Pulmonary effort is normal.      Breath sounds: Normal breath sounds. No wheezing or rales.   Abdominal:      General: Bowel sounds are normal.      Palpations: Abdomen is soft. There is no mass.      Tenderness: There is no abdominal tenderness.   Musculoskeletal:      Cervical back: Normal range of motion and neck supple.      Right lower leg: No edema.      Left lower leg: No edema.   Lymphadenopathy:      Cervical: No cervical adenopathy.   Skin:     General: Skin is warm and dry.      Findings: No rash.   Neurological:      General: No focal deficit present.      Mental Status: She is alert and oriented to person, place, and time.   Psychiatric:         Mood and Affect: Mood normal.         Behavior: Behavior normal.         Thought Content: Thought content normal.         Judgment: Judgment normal.              Assessment and Plan:    Christina Eaton is here for follow up of     1. Preventative health care    2. Non-toxic multinodular goiter    3. Right thyroid nodule    4. Anxiety    5. 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 Encounter for screening mammogram for breast cancer    8. Heterogeneously dense tissue of both breasts on mammography    9. Need for vaccination against Streptococcus pneumoniae 10. Other emphysema (HCC)    11. Iron deficiency anemia, unspecified iron deficiency anemia type        Problem   Preventative Health Care    Health Maintenance / Prevention  Health Maintenance   Topic Date Due    RSV VACCINE (60 years and Older/Pregnant Women) (1 - Risk 60-74 years 1-dose series) Never done    BREAST CANCER SCREENING  11/05/2023    PHYSICAL (COMPREHENSIVE) EXAM  10/05/2024    DTAP/TDAP VACCINES (2 - Td or Tdap) 12/04/2024    COLORECTAL CANCER SCREENING  02/28/2026    COVID-19 VACCINE  Completed    SHINGLES RECOMBINANT VACCINE  Completed    PNEUMOCOCCAL VACCINE AGE 82 AND OVER  Completed    HEPATITIS C SCREENING  Completed    DEPRESSION SCREENING  Completed    INFLUENZA VACCINE  Completed    HPV VACCINES  Aged Out    CERVICAL CANCER SCREENING  Discontinued     Immunization History   Administered Date(s) Administered    COVID-19 (PFIZER), mRNA vacc, 30 mcg/0.3 mL (PF) 11/17/2019, 12/08/2019, 07/04/2020    COVID-19 Bivalent (81YR+)(PFIZER), mRNA vacc, 72mcg/0.3mL 10/01/2021    Covid-19 mRNA Vaccine >=12yo (Pfizer)(Comirnaty) 07/22/2022, 07/20/2023    Flu Vaccine =>6 Months Quadrivalent PF 07/03/2020, 10/05/2022, 07/20/2023    Pneumococcal Vaccine (20-Val) 10/06/2023    Tdap Vaccine 12/05/2014    Zoster Vaccine Recombinant [facility] - HISTORICAL), Adjuvanted (Shingles) IM 09/08/2017, 02/05/2018    Zoster Vaccine Recombinant, Adjuvanted (shingles) IM (vial 2 of 2)(SHINGRIX) 10/20/2017, 01/03/2018     Vaccines -  COVID - booster UTD  PCV 20 -PCV 20 given 10/06/23  Zoster -Shingrix completed  Influenza - UTD  Td/Tdap -UTD  DEXA hip/spine - n/a  Low Dose Chest CT- -already getting with CA; there is mention of vascular calcification - so we added coronary calcium score 05/28/21 which was ZERO.   Colonoscopy - discussed, cologuard neg 6/17, she desired repeat cologuard - negative 05/06/20 - Colonoscopy 02/2023 with TA - repeat due in 3 years  Pap Smear  -s/p hyster benign reasons, well woman exam done 10/05/22 - no abnormalities other than atrophic vaginitis likely - discussed GSM and vaginal estrogen - she will consider. UA done for symptoms and wet prep.   Mammogram - mom had breast CA, she has dense breasts - mammo + ABUS normal 11/04/22- ordered   Lipids -great, reviewed, cac zero, low iron -addressed, excellent otherwise  ASA -n/a  Counseled on age/gender/risk appropriate safety/preventative care items  Depression Screening:  Patient Scores:  PHQ-2: PHQ-2 Score: (Patient-Rptd) 0 (09/29/2023 11:12 AM)    PHQ-9: No data recorded  Interventions:  PHQ-2: PHQ-2 Score less than 3: No follow-up or recommendations are necessary at this time (08/23/2023 12:32 PM)    Depression Interventions PHQ-2/9: No data recorded    BP elevated however no diagnosis of hypertension-check BP at home and message in a couple of weeks, normal < 120/80     Other Emphysema (Hcc)    -Noted on CT chest yearly, was described as mild in 2018  - PFTs normal 2017  - Likely from years of smoking  -She is asymptomatic.  - can repeat PFTs in the future if symptomatic     Non-Toxic Multinodular Goiter    MNG with dominant right thyroid nodule   Saw endo 2018,  thought benign, last [place] 2/19 and recommends follow up US in 2-3 years    Note CT chest shows thyroid gland appearing similar to past imaging which is reassuring   TSH 2.35 09/17/21, 1.74 09/30/22 Check TSH yearly     Thyroid US March 20, 2022 showed a new nodule right lower pole 2.2 cm T RADS 3 new since 2019 needs a follow-up recommendation is 1, 3 and 5 years.  Stable R thyroid superior pole hypoechoic solid mass 2.5 cm with internal calcifications T RADS 4 ACR guidelines show final aspiration is advised however to monitor stability is likely benign.  She did not have the biopsy due to cost.     Thyroid US ordered to compare to 2023.        Primary Mucinous Adenocarcinoma of Lung (Hcc)    Christina Eaton is a 63 yo female with pmh of tobacco abuse with recently diagnosed mucinous adenocarcinoma of the lung. She was having a calcium score screening done for cardiovascular risk calculation and the imaging showed a mass in her right lower lobe. She was referred to Dr. Ranae Plumber with pulmonary who obtained a PET scan which showed Mild FDG uptake within the right lower lobe pulmonary mass demonstrating maximum SUV of 2.06. Next a CT guided biopsy was done that was not diagnostic of malignancy. On 04/03/16 she was taken for wedge resection with pathologic evaluation concerning for malignancy, so a right lower lobectomy was completed. Pathology showed a 3.5x2.5x2.9cm primary tumor with visceral pleural invasion, 4 lymph nodes sampled were negative for malignancy. PD-L1 expression was 10%. [id number], stage IB.    They discussed the role of adjuvant chemotherapy in reducing her risk of recurrence. Given that her tumor was 3.5cm and had a poor prognostic factor of visceral pleural invasion we are recommending adjuvant cisplatin 75mg /m2 and pemetrexed 500mg /m2 Q 21 days x 4 cycles.     She declined adjuvant chemotherapy and is on active surveillance.  Per NCCN guidelines surveillance would consist of H&P, labs and CT w/ contrast every 6 months for 2-3 yrs then annually for 5 yrs. CT chest 05/15/20, 07/01/22 latest with oncology follow up - stable without recurrence, needs to reschedule follow up that was to be 08/2023.           She asked for a printed prescription for retin-a to use for cosmetic purposes of her skin, antiaging.    Iron deficiency without anemia  - Reviewed labs and suggest over-the-counter iron every other night for about 3 months increase iron in diet, check labs next year at 930       Recent URI with persistent postnasal drainage and left ear discomfort, headache  - Recommend NeilMed or Nettie pot daily for the next week plus Flonase 2 sprays to each nostril for the next week or 2  - Symptoms should resolve           Return in about 1 year (around 10/05/2024) for Physical.    Orders Placed This Encounter    US THYROID    MAMMO SCREEN BILAT/TOMO/CAD    [place] ABUS EXAM BILAT    PNEUMOCOCCAL VACCINE 20-VAL    25-OH VITAMIN D (D2 + D3)    LIPID PROFILE    CBC AND DIFF    COMPREHENSIVE METABOLIC PANEL    TSH WITH FREE T4 REFLEX    IRON + BINDING CAPACITY + %SAT+ FERRITIN    POC HEMOGLOBIN A1C    ALPRAZolam (XANAX) 0.25 mg tablet    tretinoin (  RETIN-A) 0.1 % topical cream       Patient Instructions   It was nice to see you today. Thank you for coming into clinic.  Check BP at home - normal < 120/80 -message [place] in a couple weeks with a few readings.    Use a Lloyd Huger Med or Netti pot daily x 1 week, use flonase (OTC) 2 sprays each nostril daily x 1-2 weeks for residual drainage    Take over the counter iron - ferrous sulfate 325mg  every other night (3 months or a whole bottle)  Increase iron in diet    The following testing has been ordered to be done at your convenience: mammogram/ABUS 2/28 or later, thyroid ultrasound    You may see my nurse practitioner, Luiz Iron, at any time for urgent needs or if I am unavailable.  We are working as a team to provide better continuity and access to our patients.     Follow up with me yearly and sooner as needed.     Routine Clinic Information:  Please don't hesitate to message [place] in MyChart or call if you have any problems or questions. My nurse is Marylene Land and she can be reached at [phone]. If you don't hear from [place], please follow up as we experience significant call/message volume.     For refills on medications, please have your pharmacy fax a refill authorization request form to our office at Fax) [phone]. Please allow at least 3 business days for refill requests.     For urgent issues after business hours/weekends/holidays call [phone] and request for the outpatient internal medicine physician to be paged.     We offer same day appointments for your acute health concerns. These appointments are on a first come, first serve basis. Please call [phone] if you would like to make an appointment. If I am not available, you can see Madelon Lips or any of my partners.     Take care,     Dr. Ramonita Lab

## 2023-10-06 NOTE — Progress Notes
Derma- diagnosis wants removed.     Wants ears looked at-half her face and ear hurt-lost voice during vacation, rested then went ot [place] days. Feels like when weather changes it takes her down. Had to leave work-hasn't had to do with this job. Still feels like her ear is not right. Had phlem-green and gross. Feels fine but still not 100%.     Thyroid-was supposed to get a needle biopsy-was expensive. Insurance comes back feb 1st.-may do somewhere else.     Wants to see if there is an alternative to the MRI for her husband- maybe needs dizapam/xanax-[name] [place]

## 2023-10-11 ENCOUNTER — Encounter: Admit: 2023-10-11 | Discharge: 2023-10-11 | Payer: BC Managed Care – PPO

## 2023-10-11 MED ORDER — TRETINOIN 0.1 % TP CREA
1 g | Freq: Every evening | TOPICAL | 3 refills | Status: AC
Start: 2023-10-11 — End: ?

## 2023-10-14 ENCOUNTER — Encounter: Admit: 2023-10-14 | Discharge: 2023-10-14 | Payer: BC Managed Care – PPO

## 2023-11-17 ENCOUNTER — Encounter: Admit: 2023-11-17 | Discharge: 2023-11-17 | Payer: BC Managed Care – PPO

## 2023-11-26 ENCOUNTER — Encounter: Admit: 2023-11-26 | Discharge: 2023-11-26 | Payer: BC Managed Care – PPO

## 2023-12-08 ENCOUNTER — Encounter: Admit: 2023-12-08 | Discharge: 2023-12-08

## 2023-12-08 DIAGNOSIS — H02832 Dermatochalasis of right lower eyelid: Secondary | ICD-10-CM

## 2023-12-08 MED ORDER — TRETINOIN 0.1 % TP CREA
0 refills | Status: AC
Start: 2023-12-08 — End: ?

## 2023-12-08 NOTE — Telephone Encounter
 1. Received mychart message from pharmacy requesting new Rx for   Requested Prescriptions     Pending Prescriptions Disp Refills    tretinoin (RETIN-A) 0.1 % topical cream [Pharmacy Med Name: Tretinoin 0.1 % External Cream] 20 g 0     Sig: APPLY 1 GRAM TOPICALLY TO AFFECTED AREA EVERY DAY AT BEDTIME   .    2. Last Rx written: 10/11/2023  (# 45g  x3) by PCP    3. Last Gen Med Visit: in person  on 10/06/2023    4. Next Appt due: Provider recommended return date from last office visit: 10/05/2024    Future Appointments   Date Time Provider Department Center   12/10/2023  7:30 AM SCREENING TOMO ROOM [facility] Radiology   12/10/2023  8:15 AM ABUS ROOM [facility] Radiology   02/07/2024  1:00 PM [facility] Radiology   11/13/2024  1:00 PM Polsak, Micholee B, DO [facility] IM       5. All protocol criteria met? Yes, last office visit assessment and plan reviewed.     Reason Protocol Failed: N/A    Courtesy Refills: N/A    Patient due for an office visit in 1,2, or 3 months 90 day supply, 0 refills   Patient due for an office visit in 4,5, or 6 months 90 day supply, 1 refill   Patient due for an office visit in 7,8, or 9 months 90 day supply, 2 refills   Patient due for an office visit in 10,11, or 12 months 90 day supply, 3 refills

## 2023-12-10 ENCOUNTER — Ambulatory Visit: Admit: 2023-12-10 | Discharge: 2023-12-10

## 2023-12-10 ENCOUNTER — Encounter: Admit: 2023-12-10 | Discharge: 2023-12-10

## 2023-12-11 ENCOUNTER — Encounter: Admit: 2023-12-11 | Discharge: 2023-12-11

## 2024-01-08 ENCOUNTER — Encounter: Admit: 2024-01-08 | Discharge: 2024-01-08 | Payer: BLUE CROSS/BLUE SHIELD

## 2024-01-13 ENCOUNTER — Encounter: Admit: 2024-01-13 | Discharge: 2024-01-13 | Payer: BLUE CROSS/BLUE SHIELD

## 2024-01-13 DIAGNOSIS — F419 Anxiety disorder, unspecified: Secondary | ICD-10-CM

## 2024-01-13 MED ORDER — ALPRAZOLAM 0.25 MG PO TAB
ORAL_TABLET | ORAL | 0 refills | 30.00000 days | Status: AC
Start: 2024-01-13 — End: ?

## 2024-01-13 NOTE — Telephone Encounter
 1. Received e-request from pharmacy requesting new Rx for   Requested Prescriptions     Pending Prescriptions Disp Refills    ALPRAZolam (XANAX) 0.25 mg tablet [Pharmacy Med Name: ALPRAZolam 0.25 MG Oral Tablet] 10 tablet 0     Sig: TAKE 1 TABLET BY MOUTH EVERY 24 HOURS AS NEEDED FOR ANXIETY   .    2. Last Rx written: 10/06/23  (# 10  x0) by PCP    3. Last Gen Med Visit: in person  on 10/06/23    4. Next Appt due: Provider recommended return date from last office visit: (around 10/05/2024)     Future Appointments   Date Time Provider Department Center   02/07/2024  1:00 PM [facility] Radiology   11/13/2024  1:00 PM Polsak, Micholee B, DO [facility]       5. All protocol criteria met? No, see below     Reason Protocol Failed: Non-delegated medication, routing to provider for approval.     Courtesy Refills: N/A    Patient due for an office visit in 1,2, or 3 months 90 day supply, 0 refills   Patient due for an office visit in 4,5, or 6 months 90 day supply, 1 refill   Patient due for an office visit in 7,8, or 9 months 90 day supply, 2 refills   Patient due for an office visit in 10,11, or 12 months 90 day supply, 3 refills

## 2024-02-07 ENCOUNTER — Encounter: Admit: 2024-02-07 | Discharge: 2024-02-07 | Payer: BLUE CROSS/BLUE SHIELD

## 2024-02-07 ENCOUNTER — Ambulatory Visit: Admit: 2024-02-07 | Discharge: 2024-02-07 | Payer: BLUE CROSS/BLUE SHIELD

## 2024-02-09 ENCOUNTER — Encounter: Admit: 2024-02-09 | Discharge: 2024-02-09 | Payer: BLUE CROSS/BLUE SHIELD

## 2024-02-09 DIAGNOSIS — E041 Nontoxic single thyroid nodule: Secondary | ICD-10-CM

## 2024-02-21 ENCOUNTER — Ambulatory Visit: Admit: 2024-02-21 | Discharge: 2024-02-21 | Payer: BLUE CROSS/BLUE SHIELD

## 2024-02-21 ENCOUNTER — Encounter: Admit: 2024-02-21 | Discharge: 2024-02-21 | Payer: BLUE CROSS/BLUE SHIELD

## 2024-02-23 ENCOUNTER — Encounter: Admit: 2024-02-23 | Discharge: 2024-02-23 | Payer: BLUE CROSS/BLUE SHIELD

## 2024-05-19 ENCOUNTER — Encounter: Admit: 2024-05-19 | Discharge: 2024-05-19 | Payer: BLUE CROSS/BLUE SHIELD

## 2024-05-19 DIAGNOSIS — B001 Herpesviral vesicular dermatitis: Principal | ICD-10-CM

## 2024-05-19 MED ORDER — VALACYCLOVIR 500 MG PO TAB
ORAL_TABLET | ORAL | 1 refills | 30.00000 days | Status: AC
Start: 2024-05-19 — End: ?

## 2024-05-19 NOTE — Telephone Encounter
 1. Received e-request from pharmacy requesting new Rx for   Requested Prescriptions     Pending Prescriptions Disp Refills    valACYclovir  (VALTREX ) 500 mg tablet [Pharmacy Med Name: valACYclovir  HCl 500 MG Oral Tablet] 20 tablet 0     Sig: TAKE 1 TABLET BY MOUTH TWICE DAILY FOR 1 DAY AS NEEDED FOR  COLD  SORE  FLARE   .    2. Last Rx written: 08/20/2023  (#20 x0) by PCP    3. Last Gen Med Visit: in person  on 10/06/2023    4. Next Appt due: Provider recommended return date from last office visit: 10/05/2024    Future Appointments   Date Time Provider Department Center   11/13/2024  1:00 PM Polsak, Micholee B, DO [facility]       5. All protocol criteria met? Yes, last office visit assessment and plan reviewed.     Reason Protocol Failed: N/A    Courtesy Refills: N/A    Patient due for an office visit in 1,2, or 3 months 90 day supply, 0 refills   Patient due for an office visit in 4,5, or 6 months 90 day supply, 1 refill   Patient due for an office visit in 7,8, or 9 months 90 day supply, 2 refills   Patient due for an office visit in 10,11, or 12 months 90 day supply, 3 refills

## 2024-05-24 ENCOUNTER — Encounter: Admit: 2024-05-24 | Discharge: 2024-05-24 | Payer: BLUE CROSS/BLUE SHIELD

## 2024-05-24 DIAGNOSIS — I83813 Varicose veins of bilateral lower extremities with pain: Principal | ICD-10-CM

## 2024-05-24 NOTE — Telephone Encounter
 Referral signed. Please fax to desired location. Thanks

## 2024-06-12 ENCOUNTER — Encounter: Admit: 2024-06-12 | Discharge: 2024-06-12 | Payer: BLUE CROSS/BLUE SHIELD

## 2024-07-05 ENCOUNTER — Encounter: Admit: 2024-07-05 | Discharge: 2024-07-05 | Payer: BLUE CROSS/BLUE SHIELD

## 2024-07-05 NOTE — Telephone Encounter [36]
 Patient asking if a 10 minute consult with Dr. Lenton in lieu of having to perform the bilateral venous reflux ultrasound could be arranged for her upcoming March appointment.  Please call patient to discuss further at [phone]

## 2024-07-17 ENCOUNTER — Ambulatory Visit: Admit: 2024-07-17 | Discharge: 2024-07-18 | Payer: BLUE CROSS/BLUE SHIELD

## 2024-07-17 ENCOUNTER — Encounter: Admit: 2024-07-17 | Discharge: 2024-07-17 | Payer: BLUE CROSS/BLUE SHIELD

## 2024-07-17 DIAGNOSIS — Z23 Encounter for immunization: Secondary | ICD-10-CM

## 2024-07-17 NOTE — Progress Notes [1]
 Pt arrived to clinic for walk in flu shot.Patient also requesting covid shot in addition. Advised Dr. Lidia nurse, Jon, and covid vaccine ordered for patient.  Pt verified name and DOB. Consent form reviewed and signed.  Pt received regular dose influenza vaccine in the left deltoid and covid vaccine in the right deltoid during visit. Pt tolerated well. No further questions or concerns at this time.     Reche Eagles, RN

## 2024-08-02 ENCOUNTER — Encounter: Admit: 2024-08-02 | Discharge: 2024-08-02 | Payer: BLUE CROSS/BLUE SHIELD

## 2024-08-02 DIAGNOSIS — B001 Herpesviral vesicular dermatitis: Principal | ICD-10-CM

## 2024-08-02 MED ORDER — VALACYCLOVIR 500 MG PO TAB
ORAL_TABLET | ORAL | 0 refills | 30.00000 days | Status: AC
Start: 2024-08-02 — End: ?

## 2024-08-02 NOTE — Telephone Encounter [36]
 1. Received e-request from pharmacy requesting new Rx for   Requested Prescriptions     Pending Prescriptions Disp Refills    valACYclovir  (VALTREX ) 500 mg tablet [Pharmacy Med Name: valACYclovir  HCl 500 MG Oral Tablet] 20 tablet 0     Sig: TAKE 1 TABLET BY MOUTH TWICE DAILY FOR 1 DAY AS NEEDED FOR COLD SORE FLARE   .    2. Last Rx written: 05/19/2024  (#20 x1) by PCP    3. Last Gen Med Visit: in person  on 10/06/2023    4. Next Appt due: Provider recommended return date from last office visit: 10/05/2024    Future Appointments   Date Time Provider Department Center   09/27/2024  1:15 PM [facility] Radiolog   09/29/2024 11:00 AM [facility]ORACIC NURSE PRACTITIONER [place] [facility]   11/13/2024  1:00 PM Polsak, Micholee B, DO [facility]   11/16/2024  3:30 PM Cho, Randall [facility], MD [facility] Surgery       5. All protocol criteria met? Yes, last office visit assessment and plan reviewed.     Reason Protocol Failed: N/A    Courtesy Refills: N/A    Patient due for an office visit in 1,2, or 3 months 90 day supply, 0 refills   Patient due for an office visit in 4,5, or 6 months 90 day supply, 1 refill   Patient due for an office visit in 7,8, or 9 months 90 day supply, 2 refills   Patient due for an office visit in 10,11, or 12 months 90 day supply, 3 refills

## 2024-08-09 ENCOUNTER — Encounter: Admit: 2024-08-09 | Discharge: 2024-08-09 | Payer: BLUE CROSS/BLUE SHIELD

## 2024-09-20 ENCOUNTER — Encounter: Admit: 2024-09-20 | Discharge: 2024-09-20 | Payer: BLUE CROSS/BLUE SHIELD

## 2024-09-20 NOTE — Progress Notes [1]
 Received a VM from patient that she needed to cancel her CT and appt this week due to cost of this appointment and costs of other appointments. Patient says she will r/s when she is caught up financially. Provider notified. Leonor Coke, RN

## 2024-09-29 ENCOUNTER — Encounter: Admit: 2024-09-29 | Discharge: 2024-09-29 | Payer: BLUE CROSS/BLUE SHIELD

## 2024-09-30 ENCOUNTER — Encounter: Admit: 2024-09-30 | Discharge: 2024-09-30 | Payer: BLUE CROSS/BLUE SHIELD

## 2024-10-03 ENCOUNTER — Encounter: Admit: 2024-10-03 | Discharge: 2024-10-03 | Payer: BLUE CROSS/BLUE SHIELD
